# Patient Record
Sex: Male | Born: 1950 | Race: White | Hispanic: No | Marital: Married | Smoking: Never smoker
Health system: Southern US, Community
[De-identification: ages and names within clinical notes are randomized; demographics above are authoritative.]

## PROBLEM LIST (undated history)

## (undated) DIAGNOSIS — G629 Polyneuropathy, unspecified: Secondary | ICD-10-CM

## (undated) DIAGNOSIS — I10 Essential (primary) hypertension: Secondary | ICD-10-CM

## (undated) DIAGNOSIS — K219 Gastro-esophageal reflux disease without esophagitis: Secondary | ICD-10-CM

## (undated) HISTORY — PX: HAND SURGERY: SHX662

## (undated) HISTORY — PX: BACK SURGERY: SHX140

## (undated) HISTORY — DX: Polyneuropathy, unspecified: G62.9

## (undated) HISTORY — PX: COLONOSCOPY: SHX174

## (undated) HISTORY — PX: HERNIA REPAIR: SHX51

## (undated) HISTORY — DX: Gastro-esophageal reflux disease without esophagitis: K21.9

## (undated) HISTORY — DX: Essential (primary) hypertension: I10

---

## 2014-10-29 DIAGNOSIS — I4891 Unspecified atrial fibrillation: Secondary | ICD-10-CM

## 2014-10-29 HISTORY — DX: Unspecified atrial fibrillation: I48.91

## 2015-01-15 ENCOUNTER — Emergency Department (HOSPITAL_COMMUNITY): Payer: BLUE CROSS/BLUE SHIELD

## 2015-01-15 ENCOUNTER — Observation Stay (HOSPITAL_COMMUNITY)
Admission: EM | Admit: 2015-01-15 | Discharge: 2015-01-16 | Disposition: A | Discharge status: Home / Self Care | Payer: BLUE CROSS/BLUE SHIELD | Attending: Internal Medicine | Admitting: Internal Medicine

## 2015-01-15 ENCOUNTER — Other Ambulatory Visit (HOSPITAL_COMMUNITY): Payer: Self-pay

## 2015-01-15 ENCOUNTER — Encounter (HOSPITAL_COMMUNITY): Payer: Self-pay | Admitting: Emergency Medicine

## 2015-01-15 DIAGNOSIS — R109 Unspecified abdominal pain: Secondary | ICD-10-CM | POA: Insufficient documentation

## 2015-01-15 DIAGNOSIS — A084 Viral intestinal infection, unspecified: Secondary | ICD-10-CM | POA: Diagnosis present

## 2015-01-15 DIAGNOSIS — I4891 Unspecified atrial fibrillation: Secondary | ICD-10-CM | POA: Diagnosis not present

## 2015-01-15 DIAGNOSIS — E876 Hypokalemia: Secondary | ICD-10-CM | POA: Diagnosis present

## 2015-01-15 DIAGNOSIS — R002 Palpitations: Secondary | ICD-10-CM

## 2015-01-15 DIAGNOSIS — R112 Nausea with vomiting, unspecified: Secondary | ICD-10-CM | POA: Insufficient documentation

## 2015-01-15 DIAGNOSIS — R197 Diarrhea, unspecified: Secondary | ICD-10-CM | POA: Diagnosis not present

## 2015-01-15 DIAGNOSIS — Z79899 Other long term (current) drug therapy: Secondary | ICD-10-CM | POA: Insufficient documentation

## 2015-01-15 LAB — CLOSTRIDIUM DIFFICILE BY PCR: Toxigenic C. Difficile by PCR: NEGATIVE

## 2015-01-15 LAB — COMPREHENSIVE METABOLIC PANEL
ALBUMIN: 4.5 g/dL (ref 3.5–5.2)
ALT: 46 U/L (ref 0–53)
ANION GAP: 11 (ref 5–15)
AST: 28 U/L (ref 0–37)
Alkaline Phosphatase: 69 U/L (ref 39–117)
BUN: 35 mg/dL — ABNORMAL HIGH (ref 6–23)
CO2: 21 mmol/L (ref 19–32)
Calcium: 9.5 mg/dL (ref 8.4–10.5)
Chloride: 108 mmol/L (ref 96–112)
Creatinine, Ser: 0.76 mg/dL (ref 0.50–1.35)
GFR calc Af Amer: >90 mL/min (ref >90)
Glucose, Bld: 99 mg/dL (ref 70–99)
Potassium: 2.9 mmol/L — ABNORMAL LOW (ref 3.5–5.1)
Sodium: 140 mmol/L (ref 135–145)
Total Bilirubin: 1.2 mg/dL (ref 0.3–1.2)
Total Protein: 6.9 g/dL (ref 6.0–8.3)

## 2015-01-15 LAB — CBC WITH DIFFERENTIAL/PLATELET
BASOS PCT: 0 % (ref 0–1)
Basophils Absolute: 0 10*3/uL (ref 0.0–0.1)
EOS PCT: 2 % (ref 0–5)
Eosinophils Absolute: 0.2 10*3/uL (ref 0.0–0.7)
HEMATOCRIT: 49.8 % (ref 39.0–52.0)
Hemoglobin: 17.4 g/dL — ABNORMAL HIGH (ref 13.0–17.0)
Lymphocytes Relative: 9 % — ABNORMAL LOW (ref 12–46)
Lymphs Abs: 1 10*3/uL (ref 0.7–4.0)
MCH: 30.7 pg (ref 26.0–34.0)
MCHC: 34.9 g/dL (ref 30.0–36.0)
MCV: 88 fL (ref 78.0–100.0)
MONOS PCT: 10 % (ref 3–12)
Monocytes Absolute: 1.2 10*3/uL — ABNORMAL HIGH (ref 0.1–1.0)
Neutro Abs: 9.2 10*3/uL — ABNORMAL HIGH (ref 1.7–7.7)
Neutrophils Relative %: 80 % — ABNORMAL HIGH (ref 43–77)
Platelets: 96 10*3/uL — ABNORMAL LOW (ref 150–400)
RBC: 5.66 MIL/uL (ref 4.22–5.81)
RDW: 14.4 % (ref 11.5–15.5)
WBC: 11.6 10*3/uL — ABNORMAL HIGH (ref 4.0–10.5)

## 2015-01-15 LAB — URINALYSIS, ROUTINE W REFLEX MICROSCOPIC
BILIRUBIN URINE: NEGATIVE
Glucose, UA: NEGATIVE mg/dL
Ketones, ur: NEGATIVE mg/dL
LEUKOCYTES UA: NEGATIVE
Nitrite: NEGATIVE
PROTEIN: 30 mg/dL — AB
SPECIFIC GRAVITY, URINE: 1.02 (ref 1.005–1.030)
UROBILINOGEN UA: 0.2 mg/dL (ref 0.0–1.0)
pH: 6.5 (ref 5.0–8.0)

## 2015-01-15 LAB — URINE MICROSCOPIC-ADD ON

## 2015-01-15 LAB — TSH: TSH: 0.949 u[IU]/mL (ref 0.350–4.500)

## 2015-01-15 LAB — I-STAT CG4 LACTIC ACID, ED: Lactic Acid, Venous: 1.63 mmol/L (ref 0.5–2.0)

## 2015-01-15 LAB — POC OCCULT BLOOD, ED: FECAL OCCULT BLD: POSITIVE — AB

## 2015-01-15 LAB — LIPASE, BLOOD: Lipase: 22 U/L (ref 11–59)

## 2015-01-15 LAB — TROPONIN I

## 2015-01-15 MED ORDER — SODIUM CHLORIDE 0.9 % IJ SOLN: 3.0000 mL | Freq: Two times a day (BID) | INTRAMUSCULAR | Status: DC

## 2015-01-15 MED ORDER — PANTOPRAZOLE SODIUM 40 MG PO TBEC
40.0000 mg | DELAYED_RELEASE_TABLET | Freq: Every day | ORAL | Status: DC
Start: 2015-01-15 — End: 2015-01-16
  Administered 2015-01-16: 40 mg via ORAL
  Filled 2015-01-15 (×2): qty 1

## 2015-01-15 MED ORDER — LISINOPRIL 5 MG PO TABS
5.0000 mg | ORAL_TABLET | Freq: Every day | ORAL | Status: DC
  Administered 2015-01-16: 5 mg via ORAL
  Filled 2015-01-15: qty 1

## 2015-01-15 MED ORDER — DILTIAZEM LOAD VIA INFUSION
10.0000 mg | Freq: Once | INTRAVENOUS | Status: AC
  Administered 2015-01-15: 10 mg via INTRAVENOUS
  Filled 2015-01-15: qty 10

## 2015-01-15 MED ORDER — ONDANSETRON HCL 4 MG PO TABS: 4.0000 mg | ORAL_TABLET | Freq: Four times a day (QID) | ORAL | Status: DC | PRN

## 2015-01-15 MED ORDER — DILTIAZEM HCL 100 MG IV SOLR
5.0000 mg/h | INTRAVENOUS | Status: DC
  Administered 2015-01-15 (×2): 5 mg/h via INTRAVENOUS
  Filled 2015-01-15: qty 100

## 2015-01-15 MED ORDER — ALUM & MAG HYDROXIDE-SIMETH 200-200-20 MG/5ML PO SUSP: 30.0000 mL | Freq: Four times a day (QID) | ORAL | Status: DC | PRN

## 2015-01-15 MED ORDER — POTASSIUM CHLORIDE CRYS ER 20 MEQ PO TBCR
40.0000 meq | EXTENDED_RELEASE_TABLET | Freq: Once | ORAL | Status: AC
  Administered 2015-01-15: 40 meq via ORAL
  Filled 2015-01-15: qty 2

## 2015-01-15 MED ORDER — ACETAMINOPHEN 325 MG PO TABS: 650.0000 mg | ORAL_TABLET | Freq: Four times a day (QID) | ORAL | Status: DC | PRN

## 2015-01-15 MED ORDER — ONDANSETRON HCL 4 MG/2ML IJ SOLN: 4.0000 mg | Freq: Four times a day (QID) | INTRAMUSCULAR | Status: DC | PRN

## 2015-01-15 MED ORDER — SODIUM CHLORIDE 0.9 % IV BOLUS (SEPSIS)
500.0000 mL | Freq: Once | INTRAVENOUS | Status: AC
  Administered 2015-01-15: 500 mL via INTRAVENOUS

## 2015-01-15 MED ORDER — ACETAMINOPHEN 650 MG RE SUPP: 650.0000 mg | Freq: Four times a day (QID) | RECTAL | Status: DC | PRN

## 2015-01-15 MED ORDER — DIPHENOXYLATE-ATROPINE 2.5-0.025 MG PO TABS
1.0000 | ORAL_TABLET | Freq: Four times a day (QID) | ORAL | Status: DC | PRN
  Filled 2015-01-15: qty 1

## 2015-01-15 MED ORDER — POTASSIUM CHLORIDE IN NACL 40-0.9 MEQ/L-% IV SOLN
INTRAVENOUS | Status: DC
  Administered 2015-01-16: 100 mL/h via INTRAVENOUS

## 2015-01-15 MED ORDER — ATORVASTATIN CALCIUM 10 MG PO TABS
5.0000 mg | ORAL_TABLET | Freq: Every day | ORAL | Status: DC
  Filled 2015-01-15: qty 1

## 2015-01-15 NOTE — ED Provider Notes (Addendum)
CSN: [id number]     Arrival date & time 01/15/15  1401 History  This chart was scribed for Ronald Creasehristopher J Pollina, MD by Roxy Cedarhandni Bhalodia, ED Scribe. This patient was seen in room APA05/APA05 and the patient's care was started at 2:17 PM.   Chief Complaint  Patient presents with  . Nausea  . Diarrhea  . Palpitations   Patient is a 64 y.o. male presenting with diarrhea and palpitations. The history is provided by the patient. No language interpreter was used.  Diarrhea Quality:  Watery Severity:  Moderate Timing:  Intermittent Progression:  Improving Relieved by: ice chips. Worsened by:  Nothing tried Associated symptoms: abdominal pain and vomiting   Palpitations Associated symptoms: nausea and vomiting    HPI Comments: Ronald Rivas is a 64 y.o. male who presents to the Emergency Department complaining of nausea and vomiting that began 2 nights ago. He reports associated abdominal cramping, and diarrhea. He states that he ate ice chips last night and states that the abdominal cramping has since relieved. He currently reports watery stools . He denies associated trouble breathing. He also reports onset of heart palpitations. He denies recent use of antibiotic medications. He denies recent travel. Patient states that he has been on the "whole 30 diet" since November, and has been eating healthy and intentionally losing weight. He denies prior history of atrial fibrillation.  History reviewed. No pertinent past medical history. History reviewed. No pertinent past surgical history. History reviewed. No pertinent family history. History  Substance Use Topics  . Smoking status: Never Smoker   . Smokeless tobacco: Not on file  . Alcohol Use: No   Review of Systems  Cardiovascular: Positive for palpitations.  Gastrointestinal: Positive for nausea, vomiting, abdominal pain and diarrhea.  All other systems reviewed and are negative.  Allergies  Review of patient's allergies indicates no  known allergies.  Home Medications   Prior to Admission medications   Medication Sig Start Date End Date Taking? Authorizing Provider  atorvastatin (LIPITOR) 10 MG tablet Take 5 mg by mouth daily.   Yes Historical Provider, MD  CALCIUM PO Take 1 tablet by mouth daily.   Yes Historical Provider, MD  lisinopril (PRINIVIL,ZESTRIL) 5 MG tablet Take 5 mg by mouth daily.   Yes Historical Provider, MD  Multiple Vitamin (MULTIVITAMIN WITH MINERALS) TABS tablet Take 1 tablet by mouth daily.   Yes Historical Provider, MD  omeprazole (PRILOSEC) 20 MG capsule Take 20 mg by mouth daily.   Yes Historical Provider, MD   Triage Vitals: BP 127/86 mmHg  Pulse 86  Temp(Src) 97.8 F (36.6 C) (Oral)  Resp 18  Ht 5\' 8"  (1.727 m)  Wt 184 lb (83.462 kg)  BMI 27.98 kg/m2  SpO2 93%  Physical Exam  Constitutional: He is oriented to person, place, and time. He appears well-developed and well-nourished. No distress.  HENT:  Head: Normocephalic and atraumatic.  Right Ear: Hearing normal.  Left Ear: Hearing normal.  Nose: Nose normal.  Mouth/Throat: Oropharynx is clear and moist and mucous membranes are normal.  Eyes: Conjunctivae and EOM are normal. Pupils are equal, round, and reactive to light.  Neck: Normal range of motion. Neck supple.  Cardiovascular: S1 normal and S2 normal.  A regularly irregular rhythm present. Tachycardia present.  Exam reveals no gallop and no friction rub.   No murmur heard. Pulmonary/Chest: Effort normal and breath sounds normal. No respiratory distress. He exhibits no tenderness.  Abdominal: Soft. Normal appearance and bowel sounds are normal. There is no  hepatosplenomegaly. There is no tenderness. There is no rebound, no guarding, no tenderness at McBurney's point and negative Murphy's sign. No hernia.  Genitourinary: Prostate is enlarged.  Prostate was slightly enlarged. Stool was weakly positive.  Musculoskeletal: Normal range of motion.  Neurological: He is alert and  oriented to person, place, and time. He has normal strength. No cranial nerve deficit or sensory deficit. Coordination normal. GCS eye subscore is 4. GCS verbal subscore is 5. GCS motor subscore is 6.  Skin: Skin is warm, dry and intact. No rash noted. No cyanosis.  Psychiatric: He has a normal mood and affect. His speech is normal and behavior is normal. Thought content normal.  Nursing note and vitals reviewed.  ED Course  Procedures (including critical care time)  DIAGNOSTIC STUDIES: Oxygen Saturation is 93% on RA, normal by my interpretation.    COORDINATION OF CARE: 2:22 PM- Discussed plans to order diagnostic CXR, EKG, and lab work. Will perform rectal exam. Will give patient IV fluids. Pt advised of plan for treatment and pt agrees.  Labs Review Labs Reviewed  CBC WITH DIFFERENTIAL/PLATELET - Abnormal; Notable for the following:    WBC 11.6 (*)    Hemoglobin 17.4 (*)    Platelets 96 (*)    Neutrophils Relative % 80 (*)    Neutro Abs 9.2 (*)    Lymphocytes Relative 9 (*)    Monocytes Absolute 1.2 (*)    All other components within normal limits  COMPREHENSIVE METABOLIC PANEL - Abnormal; Notable for the following:    Potassium 2.9 (*)    BUN 35 (*)    All other components within normal limits  URINALYSIS, ROUTINE W REFLEX MICROSCOPIC - Abnormal; Notable for the following:    APPearance HAZY (*)    Hgb urine dipstick TRACE (*)    Protein, ur 30 (*)    All other components within normal limits  POC OCCULT BLOOD, ED - Abnormal; Notable for the following:    Fecal Occult Bld POSITIVE (*)    All other components within normal limits  CLOSTRIDIUM DIFFICILE BY PCR  LIPASE, BLOOD  TROPONIN I  URINE MICROSCOPIC-ADD ON  I-STAT CG4 LACTIC ACID, ED    Imaging Review Dg Chest Port 1 View  01/15/2015   CLINICAL DATA:  Palpitations  EXAM: PORTABLE CHEST - 1 VIEW  COMPARISON:  None.  FINDINGS: Lungs are clear.  No pleural effusion or pneumothorax.  The heart is normal in size.   IMPRESSION: No evidence of acute cardiopulmonary disease.   Electronically Signed   By: Charline Bills M.D.   On: 01/15/2015 14:56     EKG Interpretation   Date/Time:  Saturday January 15 2015 14:21:08 EDT Ventricular Rate:  176 PR Interval:    QRS Duration: 93 QT Interval:  257 QTC Calculation: 440 R Axis:   47 Text Interpretation:  Atrial fibrillation with rapid V-rate Ventricular  premature complex Borderline low voltage, extremity leads Repolarization  abnormality, prob rate related No previous tracing Confirmed by POLLINA   MD, CHRISTOPHER [phone]) on 01/15/2015 2:54:47 PM       EKG Interpretation  Date/Time:  Saturday January 15 2015 17:13:04 EDT Ventricular Rate:  93 PR Interval:  192 QRS Duration: 99 QT Interval:  353 QTC Calculation: 439 R Axis:   75 Text Interpretation:  Sinus rhythm Consider left atrial enlargement Otherwise within normal limits Confirmed by POLLINA  MD, CHRISTOPHER [phone]) on 01/15/2015 6:10:50 PM       MDM   Final diagnoses:  Palpitations  Atrial fibrillation with RVR   Patient referred to the emergency department from urgent care for atrophic relation. Patient does not have a history of atrial fibrillation. Patient was for the last 2 days with nausea, vomiting and diarrhea. He started noticing palpitations last night. Upon arrival to the ER, patient was in nature fibrillation with rapid ventricular response in the 160s. He is not expressing any chest pain. Cardiac troponin was negative. EKG shows rate related changes, no obvious ischemia and no infarct.  Patient was placed on Cardizem drip. Because of the GI symptoms, rectal exam was performed and he was positive for occult blood. Because of this, was not administered Lovenox or heparin. Patient did achieve rate control with the Cardizem drip. He did have an episode of hypotension with blood pressure in the 60s where he had a brief syncope or near-syncope. He was laid flat, given a fluid bolus and  had significant improvement. Patient has now converted to sinus rhythm, repeat EKG shows no acute abnormalities.  I personally performed the services described in this documentation, which was scribed in my presence. The recorded information has been reviewed and is accurate.    Ronald Crease, MD 01/15/15 1719  Ronald Crease, MD 01/15/15 [phone]

## 2015-01-15 NOTE — ED Notes (Signed)
Urgent care sent pt here due to palpatations and N/V.

## 2015-01-15 NOTE — ED Notes (Signed)
Hospitalist in room with pt. 

## 2015-01-15 NOTE — H&P (Addendum)
History and Physical  Ronald Rivas RUE:[id number] DOB: 1951/06/09 DOA: 01/15/2015  Referring physician: Dr Blinda LeatherwoodPollina, ED physician PCP: No primary care provider on file.   Chief Complaint: Diarrhea and vomiting  HPI: Ronald Rivas is a 64 y.o. male  With a history of hypertension, hyperlipidemia who is seen in margins apart for 2 days of nausea, vomiting, and diarrhea. The patient presented to a walk-in clinic for care and was noticed to be in atrial fibrillation with rapid ventricular response and was sent to the hospital for evaluation and treatment. The patient hasn't noted any chest pain, shortness of breath, palpitations. His nausea and vomiting have improved but continues to have watery diarrhea. Bland diet has not improved his symptoms. Has had a fever of 100. No known sick contacts   Review of Systems:   Pt denies any chills, abdominal pain, chest pain, shortness of breath, palpitations, lightheadedness, dizziness, headaches, blurred vision.  Review of systems are otherwise negative  History reviewed. No pertinent past medical history. History reviewed. No pertinent past surgical history. Social History:  reports that he has never smoked. He does not have any smokeless tobacco history on file. He reports that he does not drink alcohol or use illicit drugs. Patient lives at home & is able to participate in activities of daily living  No Known Allergies  History reviewed. No pertinent family history. family history of hypertension   Prior to Admission medications   Medication Sig Start Date End Date Taking? Authorizing Provider  atorvastatin (LIPITOR) 10 MG tablet Take 5 mg by mouth daily.   Yes Historical Provider, MD  CALCIUM PO Take 1 tablet by mouth daily.   Yes Historical Provider, MD  lisinopril (PRINIVIL,ZESTRIL) 5 MG tablet Take 5 mg by mouth daily.   Yes Historical Provider, MD  Multiple Vitamin (MULTIVITAMIN WITH MINERALS) TABS tablet Take 1 tablet by mouth daily.   Yes  Historical Provider, MD  omeprazole (PRILOSEC) 20 MG capsule Take 20 mg by mouth daily.   Yes Historical Provider, MD    Physical Exam: BP 113/74 mmHg  Pulse 98  Temp(Src) 97.8 F (36.6 C) (Oral)  Resp 18  Ht 5\' 8"  (1.727 m)  Wt 83.462 kg (184 lb)  BMI 27.98 kg/m2  SpO2 99%  General: Older Caucasian male. Awake and alert and oriented x3. No acute cardiopulmonary distress.  Eyes: Pupils equal, round, reactive to light. Extraocular muscles are intact. Sclerae anicteric and noninjected.  ENT:  Dry mucosal membranes. No mucosal lesions.  Neck: Neck supple without lymphadenopathy. No carotid bruits. No masses palpated.  Cardiovascular: Regular rate with normal S1-S2 sounds. No murmurs, rubs, gallops auscultated. No JVD.  Respiratory: Good respiratory effort with no wheezes, rales, rhonchi. Lungs clear to auscultation bilaterally.  Abdomen: Soft, mild tenderness, nondistended. Active bowel sounds. No masses or hepatosplenomegaly  Skin: Dry, warm to touch. 2+ dorsalis pedis and radial pulses. Musculoskeletal: No calf or leg pain. All major joints not erythematous nontender.  Psychiatric: Intact judgment and insight.  Neurologic: No focal neurological deficits. Cranial nerves II through XII are grossly intact.           Labs on Admission:  Basic Metabolic Panel:  Recent Labs Lab 01/15/15 1504  NA 140  K 2.9*  CL 108  CO2 21  GLUCOSE 99  BUN 35*  CREATININE 0.76  CALCIUM 9.5   Liver Function Tests:  Recent Labs Lab 01/15/15 1504  AST 28  ALT 46  ALKPHOS 69  BILITOT 1.2  PROT 6.9  ALBUMIN  4.5    Recent Labs Lab 01/15/15 1504  LIPASE 22   No results for input(s): AMMONIA in the last 168 hours. CBC:  Recent Labs Lab 01/15/15 1504  WBC 11.6*  NEUTROABS 9.2*  HGB 17.4*  HCT 49.8  MCV 88.0  PLT 96*   Cardiac Enzymes:  Recent Labs Lab 01/15/15 1504  TROPONINI <0.03    BNP (last 3 results) No results for input(s): BNP in the last 8760  hours.  ProBNP (last 3 results) No results for input(s): PROBNP in the last 8760 hours.  CBG: No results for input(s): GLUCAP in the last 168 hours.  Radiological Exams on Admission: Dg Chest Port 1 View  01/15/2015   CLINICAL DATA:  Palpitations  EXAM: PORTABLE CHEST - 1 VIEW  COMPARISON:  None.  FINDINGS: Lungs are clear.  No pleural effusion or pneumothorax.  The heart is normal in size.  IMPRESSION: No evidence of acute cardiopulmonary disease.   Electronically Signed   By: Charline Bills M.D.   On: 01/15/2015 14:56    EKG: Independently reviewed. Atrial fibrillation with rapid ventricular response. No ST changes  Assessment/Plan Present on Admission:  . Atrial fibrillation with RVR . Hypokalemia . Viral gastroenteritis  Patient's age fibrillation has resolved. However, will admit the patient to telemetry for observation overnight to assure that the patient does not rebound into atrial fibrillation with RVR. Hypokalemia likely causative to the patient's A. Fib We'll check TSH Replace potassium Will rehydrate and provide symptomatically support for the patient's viral gastroenteritis Continue home medications  DVT prophylaxis: PlexiPulse  Consultants: None  Code Status: Full code  Family Communication: Wife in the room   Disposition Plan: Home following observation  Levie Heritage, DO [facility] Pager [phone]

## 2015-01-15 NOTE — ED Notes (Signed)
L. Cardwell in room with pt, suddenly c/o of nausea and feeling hot.  Pt laid flat.  Dr Blinda LeatherwoodPollina called into room.  Ordered to given NS bolus 1000 ml and continue Cardizem at 10mg  per her.  Hr Afib 106-155.  Current bp 106/68

## 2015-01-16 DIAGNOSIS — A084 Viral intestinal infection, unspecified: Secondary | ICD-10-CM

## 2015-01-16 DIAGNOSIS — I4891 Unspecified atrial fibrillation: Secondary | ICD-10-CM

## 2015-01-16 DIAGNOSIS — E876 Hypokalemia: Secondary | ICD-10-CM

## 2015-01-16 LAB — BASIC METABOLIC PANEL
ANION GAP: 5 (ref 5–15)
BUN: 26 mg/dL — AB (ref 6–23)
CO2: 22 mmol/L (ref 19–32)
CREATININE: 0.64 mg/dL (ref 0.50–1.35)
Calcium: 8.5 mg/dL (ref 8.4–10.5)
Chloride: 114 mmol/L — ABNORMAL HIGH (ref 96–112)
GFR calc Af Amer: >90 mL/min (ref >90)
GFR calc non Af Amer: >90 mL/min (ref >90)
Glucose, Bld: 93 mg/dL (ref 70–99)
POTASSIUM: 3.4 mmol/L — AB (ref 3.5–5.1)
Sodium: 141 mmol/L (ref 135–145)

## 2015-01-16 LAB — CBC
HCT: 43.7 % (ref 39.0–52.0)
HEMOGLOBIN: 14.7 g/dL (ref 13.0–17.0)
MCH: 29.6 pg (ref 26.0–34.0)
MCHC: 33.6 g/dL (ref 30.0–36.0)
MCV: 88.1 fL (ref 78.0–100.0)
Platelets: 88 10*3/uL — ABNORMAL LOW (ref 150–400)
RBC: 4.96 MIL/uL (ref 4.22–5.81)
RDW: 14.7 % (ref 11.5–15.5)
WBC: 8.1 10*3/uL (ref 4.0–10.5)

## 2015-01-16 LAB — MAGNESIUM: Magnesium: 1.8 mg/dL (ref 1.5–2.5)

## 2015-01-16 MED ORDER — ASPIRIN EC 81 MG PO TBEC: 81.0000 mg | DELAYED_RELEASE_TABLET | Freq: Every day | ORAL | Status: DC

## 2015-01-16 NOTE — Discharge Summary (Signed)
Physician Discharge Summary  Ronald Rivas ZOX:[id number] DOB: 1951-10-07 DOA: 01/15/2015  PCP: No primary care provider on file.  Admit date: 01/15/2015 Discharge date: 01/16/2015  Time spent: 20 minutes  Recommendations for Outpatient Follow-up:  1. Follow up with PCP in 1-2 weeks  Discharge Diagnoses:  Active Problems:   Atrial fibrillation with RVR   Hypokalemia   Viral gastroenteritis   Discharge Condition: Improved  Diet recommendation: Heart healthy  Filed Weights   01/15/15 1409  Weight: 83.462 kg (184 lb)    History of present illness:  Please review h and p from 3/19 for details. Briefly, pt presents with diarrhea with n/v and noted to have afib with RVR in the setting of hypokalemia. The patient was admitted for further work up.  Hospital Course:  The patient was admitted to the floor. Stools were found to be neg for cdiff. The patient's potassium was corrected with resolution of afib. Afib suspected to be secondary to profound hypokalemia. The patient remained stable overnight and was able to tolerate regular diet without difficulty. The patient is otherwise medically stable for close outpatient follow up. He will continue low dose ASA on discharge until seen by PCP within 1-2 weeks.  Discharge Exam: Filed Vitals:   01/15/15 1830 01/15/15 1845 01/15/15 2218 01/16/15 0650  BP: 112/70 110/67 119/66 106/71  Pulse:  78 78 69  Temp:  99.6 F (37.6 C) 99.6 F (37.6 C) 97.9 F (36.6 C)  TempSrc:  Oral Oral Oral  Resp: 17 18 18 18   Height:      Weight:      SpO2:  99% 98% 98%    General: Awake, in nad Cardiovascular: regular, s1, s2 Respiratory: normal resp effort, no wheezing  Discharge Instructions     Medication List    TAKE these medications        aspirin EC 81 MG tablet  Take 1 tablet (81 mg total) by mouth daily.     atorvastatin 10 MG tablet  Commonly known as:  LIPITOR  Take 5 mg by mouth daily.     CALCIUM PO  Take 1 tablet by mouth  daily.     lisinopril 5 MG tablet  Commonly known as:  PRINIVIL,ZESTRIL  Take 5 mg by mouth daily.     multivitamin with minerals Tabs tablet  Take 1 tablet by mouth daily.     omeprazole 20 MG capsule  Commonly known as:  PRILOSEC  Take 20 mg by mouth daily.       No Known Allergies Follow-up Information    Follow up with Follow up with your PCP in 1-2 weeks.       The results of significant diagnostics from this hospitalization (including imaging, microbiology, ancillary and laboratory) are listed below for reference.    Significant Diagnostic Studies: Dg Chest Port 1 View  01/15/2015   CLINICAL DATA:  Palpitations  EXAM: PORTABLE CHEST - 1 VIEW  COMPARISON:  None.  FINDINGS: Lungs are clear.  No pleural effusion or pneumothorax.  The heart is normal in size.  IMPRESSION: No evidence of acute cardiopulmonary disease.   Electronically Signed   By: Charline BillsSriyesh  Krishnan M.D.   On: 01/15/2015 14:56    Microbiology: Recent Results (from the past 240 hour(s))  Clostridium Difficile by PCR     Status: None   Collection Time: 01/15/15  4:00 PM  Result Value Ref Range Status   C difficile by pcr NEGATIVE NEGATIVE Final     Labs: Basic Metabolic  Panel:  Recent Labs Lab 01/15/15 1504 01/16/15 0643 01/16/15 0807  NA 140 141  --   K 2.9* 3.4*  --   CL 108 114*  --   CO2 21 22  --   GLUCOSE 99 93  --   BUN 35* 26*  --   CREATININE 0.76 0.64  --   CALCIUM 9.5 8.5  --   MG  --   --  1.8   Liver Function Tests:  Recent Labs Lab 01/15/15 1504  AST 28  ALT 46  ALKPHOS 69  BILITOT 1.2  PROT 6.9  ALBUMIN 4.5    Recent Labs Lab 01/15/15 1504  LIPASE 22   No results for input(s): AMMONIA in the last 168 hours. CBC:  Recent Labs Lab 01/15/15 1504 01/16/15 0643  WBC 11.6* 8.1  NEUTROABS 9.2*  --   HGB 17.4* 14.7  HCT 49.8 43.7  MCV 88.0 88.1  PLT 96* 88*   Cardiac Enzymes:  Recent Labs Lab 01/15/15 1504  TROPONINI <0.03   BNP: BNP (last 3  results) No results for input(s): BNP in the last 8760 hours.  ProBNP (last 3 results) No results for input(s): PROBNP in the last 8760 hours.  CBG: No results for input(s): GLUCAP in the last 168 hours.  Signed:  Monti Rivas, Scheryl Marten  [facility] 01/16/2015, 12:22 PM

## 2015-01-16 NOTE — Progress Notes (Signed)
UR completed 

## 2015-01-16 NOTE — Progress Notes (Signed)
Ronald Rivas discharged home with wife per MD order.  Discharge instructions reviewed and discussed with the patient, all questions and concerns answered. Copy of instructions and scripts given to patient.    Medication List    TAKE these medications        aspirin EC 81 MG tablet  Take 1 tablet (81 mg total) by mouth daily.     atorvastatin 10 MG tablet  Commonly known as:  LIPITOR  Take 5 mg by mouth daily.     CALCIUM PO  Take 1 tablet by mouth daily.     lisinopril 5 MG tablet  Commonly known as:  PRINIVIL,ZESTRIL  Take 5 mg by mouth daily.     multivitamin with minerals Tabs tablet  Take 1 tablet by mouth daily.     omeprazole 20 MG capsule  Commonly known as:  PRILOSEC  Take 20 mg by mouth daily.        Patients skin is clean, dry and intact, no evidence of skin break down. IV site discontinued and catheter remains intact. Site without signs and symptoms of complications. Dressing and pressure applied.  Patient escorted to car by Ladona Ridgelaylor, RN in a wheelchair,  no distress noted upon discharge.  Ronald Rivas, Ronald Rivas 01/16/2015 1:21 PM

## 2016-05-14 IMAGING — CR DG CHEST 1V PORT
1 series · 2 of 2 positions shown · non-contrast
Comparison: None.

CLINICAL DATA: Palpitations

EXAM:
PORTABLE CHEST - 1 VIEW

[Series 1: portable · 0.17mm/px · 2 of 2 slices shown]
[im 1/2]
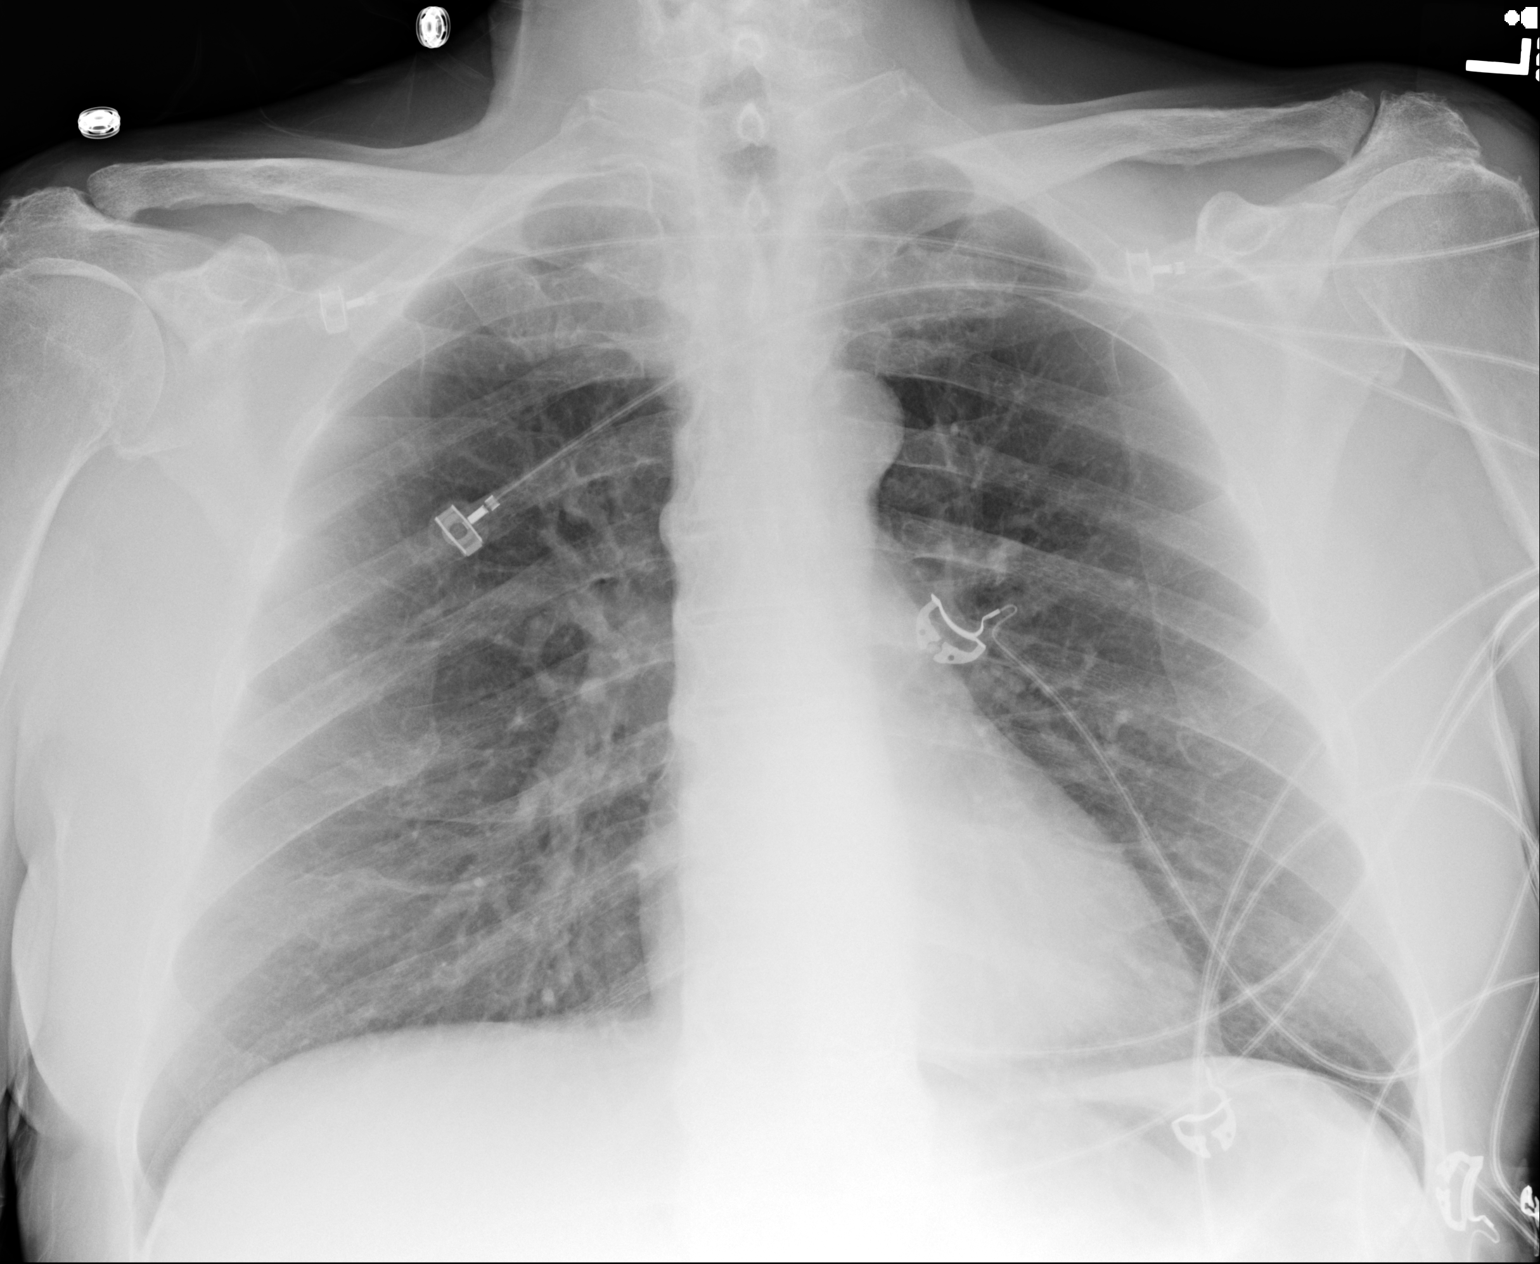
[im 2/2]
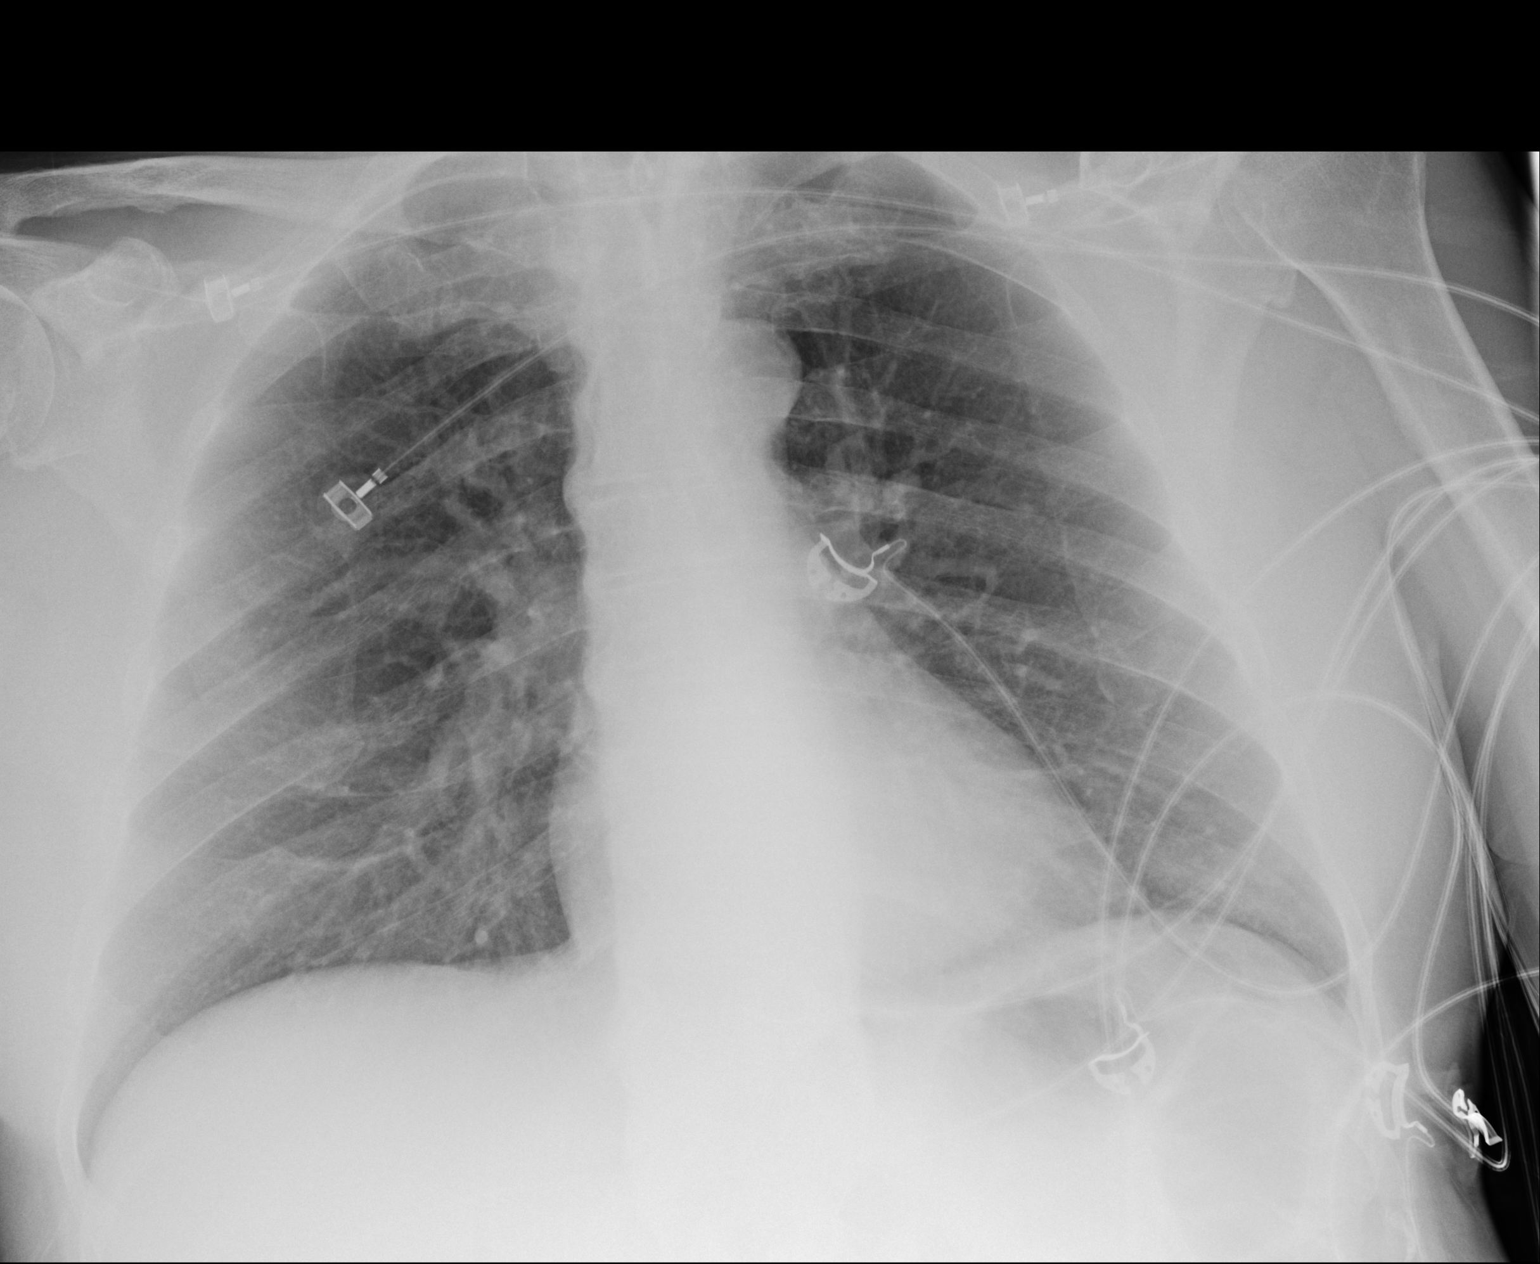

[2 of 2 positions shown; findings below may reference images not displayed]

FINDINGS: Lungs are clear.  No pleural effusion or pneumothorax.

The heart is normal in size.
IMPRESSION: No evidence of acute cardiopulmonary disease.

## 2022-11-19 ENCOUNTER — Encounter: Payer: Self-pay | Admitting: Internal Medicine

## 2022-12-12 ENCOUNTER — Ambulatory Visit (INDEPENDENT_AMBULATORY_CARE_PROVIDER_SITE_OTHER): Payer: Medicare Other | Admitting: Internal Medicine

## 2022-12-12 ENCOUNTER — Encounter: Payer: Self-pay | Admitting: Internal Medicine

## 2022-12-12 VITALS — BP 122/68 | HR 64 | Ht 68.0 in | Wt 189.0 lb

## 2022-12-12 DIAGNOSIS — R1310 Dysphagia, unspecified: Secondary | ICD-10-CM | POA: Diagnosis not present

## 2022-12-12 DIAGNOSIS — K219 Gastro-esophageal reflux disease without esophagitis: Secondary | ICD-10-CM

## 2022-12-12 DIAGNOSIS — K5900 Constipation, unspecified: Secondary | ICD-10-CM

## 2022-12-12 DIAGNOSIS — R194 Change in bowel habit: Secondary | ICD-10-CM

## 2022-12-12 MED ORDER — PLENVU 140 G PO SOLR: 1.0000 | Freq: Once | ORAL | 0 refills | Status: AC

## 2022-12-12 NOTE — Patient Instructions (Signed)
_______________________________________________________  If your blood pressure at your visit was 140/90 or greater, please contact your primary care physician to follow up on this.  _______________________________________________________  If you are age 73 or older, your body mass index should be between 23-30. Your Body mass index is 28.74 kg/m. If this is out of the aforementioned range listed, please consider follow up with your Primary Care Provider.  If you are age 30 or younger, your body mass index should be between 19-25. Your Body mass index is 28.74 kg/m. If this is out of the aformentioned range listed, please consider follow up with your Primary Care Provider.   ________________________________________________________  The [facility] providers would like to encourage you to use [facility] to communicate with providers for non-urgent requests or questions.  Due to long hold times on the telephone, sending your provider a message by [facility] may be a faster and more efficient way to get a response.  Please allow 48 business hours for a response.  Please remember that this is for non-urgent requests.  _______________________________________________________  Dennis Bast have been scheduled for an endoscopy and colonoscopy. Please follow the written instructions given to you at your visit today. Please pick up your prep supplies at the pharmacy within the next 1-3 days. If you use inhalers (even only as needed), please bring them with you on the day of your procedure.  Take Miralax daily

## 2022-12-12 NOTE — Progress Notes (Signed)
HISTORY OF PRESENT ILLNESS:  Ronald Rivas is a 72 y.o. 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  He is accompanied by his wife.  Patient reports remote colonoscopy in [place].  States he was told that he has "pockets".  Tells me that he was in his usual state of health until a few months ago when he has noticed abrupt change in his bowel habits.  He describes constipation.  Decreased stool caliber.  Has been taking prune juice which helps.  Also feels like he has swollen hemorrhoids.  He is concerned.  Good appetite.  No weight loss.  No bleeding.  He reports a history of GERD.  He takes omeprazole sporadically.  This helps.  He also reports some intermittent dysphagia.  Does tell me that he did require esophageal dilation on 1 occasion remotely.  He is not on blood thinners  REVIEW OF SYSTEMS:  All non-GI ROS negative unless otherwise stated in the HPI except for back pain, hearing impairment  Past Medical History:  Diagnosis Date   GERD (gastroesophageal reflux disease)    Hypertension     Past Surgical History:  Procedure Laterality Date   BACK SURGERY     COLONOSCOPY     HERNIA REPAIR      Social History Ronald Rivas  reports that he has never smoked. He does not have any smokeless tobacco history on file. He reports that he does not drink alcohol and does not use drugs.  family history is not on file.  No Known Allergies     PHYSICAL EXAMINATION: Vital signs: BP 122/68   Pulse 64   Ht 5' 8"$  (1.727 m)   Wt 189 lb (85.7 kg)   SpO2 96%   BMI 28.74 kg/m   Constitutional: generally well-appearing, no acute distress.  Hard of hearing Psychiatric: alert and oriented x3, cooperative Eyes: extraocular movements intact, anicteric, conjunctiva pink Mouth: oral pharynx moist, no lesions Neck: supple no lymphadenopathy Cardiovascular: heart regular rate and rhythm, no murmur Lungs: clear to  auscultation bilaterally Abdomen: soft, nontender, nondistended, no obvious ascites, no peritoneal signs, normal bowel sounds, no organomegaly Rectal: Deferred until colonoscopy Extremities: no clubbing, cyanosis, or lower extremity edema bilaterally Skin: no lesions on visible extremities Neuro: No focal deficits.  Cranial nerves intact  ASSESSMENT:  1.  Change in bowel habits.  Change in stool caliber and constipation.  Needs further evaluation. 2.  Probable hemorrhoids 3.  Chronic GERD 4.  Intermittent dysphagia.  Rule out peptic stricture   PLAN:  1.  Reflux precautions 2.  Continue PPI 3.  Schedule upper endoscopy with possible esophageal dilation.The nature of the procedure, as well as the risks, benefits, and alternatives were carefully and thoroughly reviewed with the patient. Ample time for discussion and questions allowed. The patient understood, was satisfied, and agreed to proceed. 4.  Schedule colonoscopy to evaluate change in bowel habits.The nature of the procedure, as well as the risks, benefits, and alternatives were carefully and thoroughly reviewed with the patient. Ample time for discussion and questions allowed. The patient understood, was satisfied, and agreed to proceed. 5.  Further recommendations after the above. A total time of 60 minutes was spent preparing to see the patient, obtaining comprehensive history, performing medically appropriate physical examination, ordering multiple endoscopic procedures including therapeutic procedure, counseling and educating the patient and his wife regarding the above listed issues, and documenting clinical information in the health record.

## 2023-01-22 ENCOUNTER — Ambulatory Visit: Payer: Medicare Other | Admitting: Internal Medicine

## 2023-01-22 ENCOUNTER — Encounter: Payer: Self-pay | Admitting: Internal Medicine

## 2023-01-22 VITALS — BP 117/63 | HR 110 | Resp 20

## 2023-01-22 DIAGNOSIS — K317 Polyp of stomach and duodenum: Secondary | ICD-10-CM

## 2023-01-22 DIAGNOSIS — K219 Gastro-esophageal reflux disease without esophagitis: Secondary | ICD-10-CM

## 2023-01-22 DIAGNOSIS — D124 Benign neoplasm of descending colon: Secondary | ICD-10-CM

## 2023-01-22 DIAGNOSIS — R194 Change in bowel habit: Secondary | ICD-10-CM

## 2023-01-22 DIAGNOSIS — I10 Essential (primary) hypertension: Secondary | ICD-10-CM | POA: Insufficient documentation

## 2023-01-22 DIAGNOSIS — D123 Benign neoplasm of transverse colon: Secondary | ICD-10-CM

## 2023-01-22 DIAGNOSIS — D122 Benign neoplasm of ascending colon: Secondary | ICD-10-CM

## 2023-01-22 DIAGNOSIS — Z1211 Encounter for screening for malignant neoplasm of colon: Secondary | ICD-10-CM

## 2023-01-22 DIAGNOSIS — K5900 Constipation, unspecified: Secondary | ICD-10-CM

## 2023-01-22 DIAGNOSIS — R1310 Dysphagia, unspecified: Secondary | ICD-10-CM

## 2023-01-22 DIAGNOSIS — K222 Esophageal obstruction: Secondary | ICD-10-CM | POA: Diagnosis not present

## 2023-01-22 MED ORDER — SODIUM CHLORIDE 0.9 % IV SOLN: 500.0000 mL | Freq: Once | INTRAVENOUS | Status: DC

## 2023-01-22 NOTE — Patient Instructions (Addendum)
Resume previous diet. Continue present medications. Await pathology results. Contact your PCP ASAP regarding cardiology referral.  Follow dilation diet                             YOU HAD AN ENDOSCOPIC PROCEDURE TODAY AT [facility]:   Refer to the procedure report that was given to you for any specific questions about what was found during the examination.  If the procedure report does not answer your questions, please call your gastroenterologist to clarify.  If you requested that your care partner not be given the details of your procedure findings, then the procedure report has been included in a sealed envelope for you to review at your convenience later.  YOU SHOULD EXPECT: Some feelings of bloating in the abdomen. Passage of more gas than usual.  Walking can help get rid of the air that was put into your GI tract during the procedure and reduce the bloating. If you had a lower endoscopy (such as a colonoscopy or flexible sigmoidoscopy) you may notice spotting of blood in your stool or on the toilet paper. If you underwent a bowel prep for your procedure, you may not have a normal bowel movement for a few days.  Please Note:  You might notice some irritation and congestion in your nose or some drainage.  This is from the oxygen used during your procedure.  There is no need for concern and it should clear up in a day or so.  SYMPTOMS TO REPORT IMMEDIATELY:  Following lower endoscopy (colonoscopy or flexible sigmoidoscopy):  Excessive amounts of blood in the stool  Significant tenderness or worsening of abdominal pains  Swelling of the abdomen that is new, acute  Fever of 100F or higher  Following upper endoscopy (EGD)  Vomiting of blood or coffee ground material  New chest pain or pain under the shoulder blades  Painful or persistently difficult swallowing  New shortness of breath  Fever of 100F or higher  Black, tarry-looking stools  For urgent or emergent  issues, a gastroenterologist can be reached at any hour by calling [phone]. Do not use MyChart messaging for urgent concerns.    DIET:  FOLLOW DILATION DIET!!!  Drink plenty of fluids but you should avoid alcoholic beverages for 24 hours.  ACTIVITY:  You should plan to take it easy for the rest of today and you should NOT DRIVE or use heavy machinery until tomorrow (because of the sedation medicines used during the test).    FOLLOW UP: Our staff will call the number listed on your records the next business day following your procedure.  We will call around 7:15- 8:00 am to check on you and address any questions or concerns that you may have regarding the information given to you following your procedure. If we do not reach you, we will leave a message.     If any biopsies were taken you will be contacted by phone or by letter within the next 1-3 weeks.  Please call us at [phone] if you have not heard about the biopsies in 3 weeks.    SIGNATURES/CONFIDENTIALITY: You and/or your care partner have signed paperwork which will be entered into your electronic medical record.  These signatures attest to the fact that that the information above on your After Visit Summary has been reviewed and is understood.  Full responsibility of the confidentiality of this discharge information lies with you  and/or your care-partner.

## 2023-01-22 NOTE — Op Note (Signed)
[place] Patient Name: Ronald Rivas Procedure Date: 01/22/2023 1:15 PM MRN: [id number] Endoscopist: Docia Chuck. Henrene Pastor , MD, [id number] Age: 72 Referring MD:  Date of Birth: [birth year] Gender: Male Account #: [id number] Procedure:                Colonoscopy with cold snare polypectomy x 9; biopsy                            polypectomy x 1 Indications:              Screening for colorectal malignant neoplasm.                            Reports negative exam remotely elsewhere.                            Incidental change in bowel habits Medicines:                Monitored Anesthesia Care Procedure:                Pre-Anesthesia Assessment:                           - Prior to the procedure, a History and Physical                            was performed, and patient medications and                            allergies were reviewed. The patient's tolerance of                            previous anesthesia was also reviewed. The risks                            and benefits of the procedure and the sedation                            options and risks were discussed with the patient.                            All questions were answered, and informed consent                            was obtained. Prior Anticoagulants: The patient has                            taken no anticoagulant or antiplatelet agents.                            After reviewing the risks and benefits, the patient                            was deemed in satisfactory condition to undergo the  procedure.                           After obtaining informed consent, the colonoscope                            was passed under direct vision. Throughout the                            procedure, the patient's blood pressure, pulse, and                            oxygen saturations were monitored continuously. The                            CF [id number] was introduced through the  anus                            and advanced to the the cecum, identified by                            appendiceal orifice and ileocecal valve. The                            ileocecal valve, appendiceal orifice, and rectum                            were photographed. The quality of the bowel                            preparation was excellent. The colonoscopy was                            performed without difficulty. The patient tolerated                            the procedure well. The bowel preparation used was                            SUPREP via split dose instruction. Scope In: 1:27:57 PM Scope Out: 1:48:54 PM Scope Withdrawal Time: 0 hours 17 minutes 25 seconds  Total Procedure Duration: 0 hours 20 minutes 57 seconds  Findings:                 Nine polyps were found in the descending colon,                            transverse colon and ascending colon. The polyps                            were 2 to 5 mm in size. These polyps were removed                            with a cold snare. Resection and retrieval were  complete.                           A 1 mm polyp was found in the descending colon. The                            polyp was removed with a jumbo cold forceps.                            Resection and retrieval were complete.                           Multiple diverticula were found in the sigmoid                            colon.                           Internal hemorrhoids were found during retroflexion.                           The exam was otherwise without abnormality on                            direct and retroflexion views. Complications:            No immediate complications. Estimated blood loss:                            None. Estimated Blood Loss:     Estimated blood loss: none. Impression:               - Nine 2 to 5 mm polyps in the descending colon, in                            the transverse colon and in the  ascending colon,                            removed with a cold snare. Resected and retrieved.                           - One 1 mm polyp in the descending colon, removed                            with a jumbo cold forceps. Resected and retrieved.                           - Diverticulosis in the sigmoid colon.                           - Internal hemorrhoids.                           - The examination was otherwise normal on direct  and retroflexion views. Recommendation:           - Repeat colonoscopy in 1 year for surveillance if                            all polyps adenomatous, otherwise 3 years.                           - Patient has a contact number available for                            emergencies. The signs and symptoms of potential                            delayed complications were discussed with the                            patient. Return to normal activities tomorrow.                            Written discharge instructions were provided to the                            patient.                           - Resume previous diet.                           - Continue present medications.                           - Await pathology results.                           NOTE: Patient was found to be in atrial                            fibrillation with rapid ventricular rate. He has a                            history of the same. Completely asymptomatic with                            normal blood pressure.                           The patient and his wife were advised. EKG                            provided. Told to contact her PCP ASAP regarding                            cardiology referral. Docia Chuck. Henrene Pastor, MD 01/22/2023 1:58:15 PM This report has been signed electronically.

## 2023-01-22 NOTE — Progress Notes (Signed)
Called to room to assist during endoscopic procedure.  Patient ID and intended procedure confirmed with present staff. Received instructions for my participation in the procedure from the performing physician.  

## 2023-01-22 NOTE — Op Note (Signed)
[facility] Patient Name: Ronald Rivas Procedure Date: 01/22/2023 1:14 PM MRN: [id number] Endoscopist: Docia Chuck. Henrene Pastor , MD, [id number] Age: 72 Referring MD:  Date of Birth: [birth year] Gender: Male Account #: [id number] Procedure:                Upper GI endoscopy with balloon dilation of the                            esophagus?"20 mm max; w/ biopsies Indications:              Dysphagia, Esophageal reflux Medicines:                Monitored Anesthesia Care Procedure:                Pre-Anesthesia Assessment:                           - Prior to the procedure, a History and Physical                            was performed, and patient medications and                            allergies were reviewed. The patient's tolerance of                            previous anesthesia was also reviewed. The risks                            and benefits of the procedure and the sedation                            options and risks were discussed with the patient.                            All questions were answered, and informed consent                            was obtained. Prior Anticoagulants: The patient has                            taken no anticoagulant or antiplatelet agents. ASA                            Grade Assessment: II - A patient with mild systemic                            disease. After reviewing the risks and benefits,                            the patient was deemed in satisfactory condition to                            undergo the procedure.  After obtaining informed consent, the endoscope was                            passed under direct vision. Throughout the                            procedure, the patient's blood pressure, pulse, and                            oxygen saturations were monitored continuously. The                            GIF [id number] was introduced through the                            mouth, and  advanced to the second part of duodenum.                            The upper GI endoscopy was accomplished without                            difficulty. The patient tolerated the procedure                            well. Scope In: Scope Out: Findings:                 The esophagus revealed very mild stricturing at the                            gastroesophageal junction. No active inflammation.                            No Barrett's. A TTS dilator was passed through the                            scope. Dilation with an 18-19-20 mm balloon dilator                            was performed to 20 mm.                           The stomach revealed a hiatal hernia and multiple                            benign-appearing fundic gland type polyps. Biopsies                            were taken, from several representative polyps,                            with a cold forceps for histology.                           The examined duodenum was  normal.                           The cardia and gastric fundus were normal on                            retroflexion. Complications:            No immediate complications. Estimated Blood Loss:     Estimated blood loss: none. Impression:               1. GERD with mild esophageal stricture status post                            dilation to 20 mm max                           2. Hiatal hernia                           3. Incidental benign-appearing fundic gland polyps.                            Biopsied                           4. Otherwise normal EGD. Recommendation:           - Patient has a contact number available for                            emergencies. The signs and symptoms of potential                            delayed complications were discussed with the                            patient. Return to normal activities tomorrow.                            Written discharge instructions were provided to the                             patient.                           - Resume previous diet.                           - Continue present medications.                           - Await pathology results.                           NOTE: Patient was found to be in atrial                            fibrillation with rapid ventricular  rate. He has a                            history of the same. Completely asymptomatic with                            normal blood pressure.                           The patient and his wife were advised. EKG                            provided. Told to contact her PCP ASAP regarding                            cardiology referral. Docia Chuck. Henrene Pastor, MD 01/22/2023 2:10:55 PM This report has been signed electronically.

## 2023-01-22 NOTE — Progress Notes (Signed)
HISTORY OF PRESENT ILLNESS:   Ronald Rivas is a 72 y.o. 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  He is accompanied by his wife.   Patient reports remote colonoscopy in [place].  States he was told that he has "pockets".  Tells me that he was in his usual state of health until a few months ago when he has noticed abrupt change in his bowel habits.  He describes constipation.  Decreased stool caliber.  Has been taking prune juice which helps.  Also feels like he has swollen hemorrhoids.  He is concerned.  Good appetite.  No weight loss.  No bleeding.   He reports a history of GERD.  He takes omeprazole sporadically.  This helps.  He also reports some intermittent dysphagia.  Does tell me that he did require esophageal dilation on 1 occasion remotely.   He is not on blood thinners   REVIEW OF SYSTEMS:   All non-GI ROS negative unless otherwise stated in the HPI except for back pain, hearing impairment       Past Medical History:  Diagnosis Date   GERD (gastroesophageal reflux disease)     Hypertension             Past Surgical History:  Procedure Laterality Date   BACK SURGERY       COLONOSCOPY       HERNIA REPAIR          Social History Ronald Rivas  reports that he has never smoked. He does not have any smokeless tobacco history on file. He reports that he does not drink alcohol and does not use drugs.   family history is not on file.   No Known Allergies       PHYSICAL EXAMINATION: Vital signs: BP 122/68   Pulse 64   Ht 5\' 8"  (1.727 m)   Wt 189 lb (85.7 kg)   SpO2 96%   BMI 28.74 kg/m   Constitutional: generally well-appearing, no acute distress.  Hard of hearing Psychiatric: alert and oriented x3, cooperative Eyes: extraocular movements intact, anicteric, conjunctiva pink Mouth: oral pharynx moist, no lesions Neck: supple no lymphadenopathy Cardiovascular: heart regular rate and  rhythm, no murmur Lungs: clear to auscultation bilaterally Abdomen: soft, nontender, nondistended, no obvious ascites, no peritoneal signs, normal bowel sounds, no organomegaly Rectal: Deferred until colonoscopy Extremities: no clubbing, cyanosis, or lower extremity edema bilaterally Skin: no lesions on visible extremities Neuro: No focal deficits.  Cranial nerves intact   ASSESSMENT:   1.  Change in bowel habits.  Change in stool caliber and constipation.  Needs further evaluation. 2.  Probable hemorrhoids 3.  Chronic GERD 4.  Intermittent dysphagia.  Rule out peptic stricture     PLAN:   1.  Reflux precautions 2.  Continue PPI 3.  Schedule upper endoscopy with possible esophageal dilation.The nature of the procedure, as well as the risks, benefits, and alternatives were carefully and thoroughly reviewed with the patient. Ample time for discussion and questions allowed. The patient understood, was satisfied, and agreed to proceed. 4.  Schedule colonoscopy to evaluate change in bowel habits.The nature of the procedure, as well as the risks, benefits, and alternatives were carefully and thoroughly reviewed with the patient. Ample time for discussion and questions allowed. The patient understood, was satisfied, and agreed to proceed. 5.  Further recommendations after the above.  Patient presents today for colonoscopy and upper endoscopy to evaluate change in  bowel habits/colon cancer screening and chronic GERD with intermittent dysphagia.  No interval change in history.  No clinical change.  However, noted to be in A-fib.  He does have a history of A-fib for which she was previously hospitalized.  He is asymptomatic with normal blood pressure.  As such, we will proceed.  He will need to follow-up with his PCP or cardiologist regarding A-fib.

## 2023-01-22 NOTE — Progress Notes (Signed)
Pt's has denied chest pain and SOB while in the RR.

## 2023-01-22 NOTE — Progress Notes (Signed)
A and O x3. Report to RN. Tolerated MAC anesthesia well.Teeth unchanged after procedure. 

## 2023-01-23 ENCOUNTER — Telehealth: Payer: Self-pay

## 2023-01-23 NOTE — Telephone Encounter (Signed)
Attempted f/u call. No answer, left VM. 

## 2023-01-26 ENCOUNTER — Encounter: Payer: Self-pay | Admitting: Internal Medicine

## 2023-02-25 ENCOUNTER — Observation Stay (HOSPITAL_COMMUNITY)
Admission: EM | Admit: 2023-02-25 | Discharge: 2023-02-26 | Disposition: A | Discharge status: Home / Self Care | Payer: Medicare Other | Attending: Internal Medicine | Admitting: Internal Medicine

## 2023-02-25 ENCOUNTER — Ambulatory Visit (INDEPENDENT_AMBULATORY_CARE_PROVIDER_SITE_OTHER): Payer: Medicare Other | Admitting: Internal Medicine

## 2023-02-25 ENCOUNTER — Encounter (HOSPITAL_COMMUNITY): Payer: Self-pay | Admitting: *Deleted

## 2023-02-25 ENCOUNTER — Encounter: Payer: Self-pay | Admitting: Internal Medicine

## 2023-02-25 ENCOUNTER — Emergency Department (HOSPITAL_COMMUNITY): Payer: Medicare Other

## 2023-02-25 ENCOUNTER — Other Ambulatory Visit: Payer: Self-pay

## 2023-02-25 VITALS — BP 148/82 | HR 145 | Ht 68.0 in | Wt 186.2 lb

## 2023-02-25 DIAGNOSIS — I484 Atypical atrial flutter: Secondary | ICD-10-CM | POA: Insufficient documentation

## 2023-02-25 DIAGNOSIS — I4891 Unspecified atrial fibrillation: Secondary | ICD-10-CM | POA: Insufficient documentation

## 2023-02-25 DIAGNOSIS — R0602 Shortness of breath: Secondary | ICD-10-CM | POA: Insufficient documentation

## 2023-02-25 DIAGNOSIS — E785 Hyperlipidemia, unspecified: Secondary | ICD-10-CM | POA: Insufficient documentation

## 2023-02-25 DIAGNOSIS — M799 Soft tissue disorder, unspecified: Secondary | ICD-10-CM | POA: Diagnosis not present

## 2023-02-25 DIAGNOSIS — E7849 Other hyperlipidemia: Secondary | ICD-10-CM | POA: Diagnosis not present

## 2023-02-25 DIAGNOSIS — Z79899 Other long term (current) drug therapy: Secondary | ICD-10-CM | POA: Diagnosis not present

## 2023-02-25 DIAGNOSIS — I10 Essential (primary) hypertension: Secondary | ICD-10-CM | POA: Diagnosis not present

## 2023-02-25 DIAGNOSIS — M7989 Other specified soft tissue disorders: Secondary | ICD-10-CM | POA: Diagnosis not present

## 2023-02-25 DIAGNOSIS — I4892 Unspecified atrial flutter: Secondary | ICD-10-CM | POA: Insufficient documentation

## 2023-02-25 LAB — COMPREHENSIVE METABOLIC PANEL
ALT: 28 U/L (ref 0–44)
AST: 22 U/L (ref 15–41)
Albumin: 4 g/dL (ref 3.5–5.0)
Alkaline Phosphatase: 72 U/L (ref 38–126)
Anion gap: 7 (ref 5–15)
BUN: 21 mg/dL (ref 8–23)
CO2: 23 mmol/L (ref 22–32)
Calcium: 9.2 mg/dL (ref 8.9–10.3)
Chloride: 109 mmol/L (ref 98–111)
Creatinine, Ser: 0.66 mg/dL (ref 0.61–1.24)
GFR, Estimated: >60 mL/min (ref >60)
Glucose, Bld: 99 mg/dL (ref 70–99)
Potassium: 3.5 mmol/L (ref 3.5–5.1)
Sodium: 139 mmol/L (ref 135–145)
Total Bilirubin: 1.3 mg/dL — ABNORMAL HIGH (ref 0.3–1.2)
Total Protein: 6.5 g/dL (ref 6.5–8.1)

## 2023-02-25 LAB — CBC WITH DIFFERENTIAL/PLATELET
Abs Immature Granulocytes: 0.02 10*3/uL (ref 0.00–0.07)
Basophils Absolute: 0.1 10*3/uL (ref 0.0–0.1)
Basophils Relative: 1 %
Eosinophils Absolute: 0.2 10*3/uL (ref 0.0–0.5)
Eosinophils Relative: 3 %
HCT: 41.8 % (ref 39.0–52.0)
Hemoglobin: 13.9 g/dL (ref 13.0–17.0)
Immature Granulocytes: 0 %
Lymphocytes Relative: 21 %
Lymphs Abs: 1.5 10*3/uL (ref 0.7–4.0)
MCH: 30.2 pg (ref 26.0–34.0)
MCHC: 33.3 g/dL (ref 30.0–36.0)
MCV: 90.7 fL (ref 80.0–100.0)
Monocytes Absolute: 0.6 10*3/uL (ref 0.1–1.0)
Monocytes Relative: 9 %
Neutro Abs: 4.6 10*3/uL (ref 1.7–7.7)
Neutrophils Relative %: 66 %
Platelets: 106 10*3/uL — ABNORMAL LOW (ref 150–400)
RBC: 4.61 MIL/uL (ref 4.22–5.81)
RDW: 14.4 % (ref 11.5–15.5)
WBC: 6.9 10*3/uL (ref 4.0–10.5)
nRBC: 0 % (ref 0.0–0.2)

## 2023-02-25 LAB — TROPONIN I (HIGH SENSITIVITY)
Troponin I (High Sensitivity): 7 ng/L (ref <18)
Troponin I (High Sensitivity): 7 ng/L (ref <18)

## 2023-02-25 LAB — BRAIN NATRIURETIC PEPTIDE: B Natriuretic Peptide: 144 pg/mL — ABNORMAL HIGH (ref 0.0–100.0)

## 2023-02-25 LAB — TSH: TSH: 0.942 u[IU]/mL (ref 0.350–4.500)

## 2023-02-25 LAB — MAGNESIUM: Magnesium: 2 mg/dL (ref 1.7–2.4)

## 2023-02-25 LAB — MRSA NEXT GEN BY PCR, NASAL: MRSA by PCR Next Gen: NOT DETECTED

## 2023-02-25 MED ORDER — PANTOPRAZOLE SODIUM 40 MG PO TBEC
40.0000 mg | DELAYED_RELEASE_TABLET | Freq: Every day | ORAL | Status: DC
  Administered 2023-02-26: 40 mg via ORAL
  Filled 2023-02-25: qty 1

## 2023-02-25 MED ORDER — DILTIAZEM HCL-DEXTROSE 125-5 MG/125ML-% IV SOLN (PREMIX)
5.0000 mg/h | INTRAVENOUS | Status: DC
  Administered 2023-02-25: 5 mg/h via INTRAVENOUS
  Filled 2023-02-25: qty 125

## 2023-02-25 MED ORDER — ACETAMINOPHEN 325 MG PO TABS: 650.0000 mg | ORAL_TABLET | Freq: Four times a day (QID) | ORAL | Status: DC | PRN

## 2023-02-25 MED ORDER — ONDANSETRON HCL 4 MG/2ML IJ SOLN: 4.0000 mg | Freq: Four times a day (QID) | INTRAMUSCULAR | Status: DC | PRN

## 2023-02-25 MED ORDER — ACETAMINOPHEN 650 MG RE SUPP: 650.0000 mg | Freq: Four times a day (QID) | RECTAL | Status: DC | PRN

## 2023-02-25 MED ORDER — DILTIAZEM LOAD VIA INFUSION
15.0000 mg | Freq: Once | INTRAVENOUS | Status: AC
  Administered 2023-02-25: 15 mg via INTRAVENOUS

## 2023-02-25 MED ORDER — HEPARIN BOLUS VIA INFUSION
3500.0000 [IU] | Freq: Once | INTRAVENOUS | Status: AC
  Administered 2023-02-25: 3500 [IU] via INTRAVENOUS

## 2023-02-25 MED ORDER — POLYETHYLENE GLYCOL 3350 17 G PO PACK: 17.0000 g | PACK | Freq: Every day | ORAL | Status: DC | PRN

## 2023-02-25 MED ORDER — ATORVASTATIN CALCIUM 20 MG PO TABS
20.0000 mg | ORAL_TABLET | Freq: Every day | ORAL | Status: DC
  Administered 2023-02-26: 20 mg via ORAL
  Filled 2023-02-25: qty 1

## 2023-02-25 MED ORDER — ONDANSETRON HCL 4 MG PO TABS: 4.0000 mg | ORAL_TABLET | Freq: Four times a day (QID) | ORAL | Status: DC | PRN

## 2023-02-25 MED ORDER — HEPARIN (PORCINE) 25000 UT/250ML-% IV SOLN
1200.0000 [IU]/h | INTRAVENOUS | Status: DC
  Administered 2023-02-25: 1200 [IU]/h via INTRAVENOUS
  Filled 2023-02-25: qty 250

## 2023-02-25 MED ORDER — CHLORHEXIDINE GLUCONATE CLOTH 2 % EX PADS
6.0000 | MEDICATED_PAD | Freq: Every day | CUTANEOUS | Status: DC
  Administered 2023-02-25 – 2023-02-26 (×2): 6 via TOPICAL

## 2023-02-25 NOTE — Assessment & Plan Note (Signed)
Bilateral lower extremity swelling over the past month, setting of atrial fibrillation.  Reports outpatient lower extremity venous Doppler negative for DVT.  I do not see reports.  BNP 144, no prior to compare. -Obtain records -Hold home Lasix 20 mg as needed for now, to allow for titration of rate limiting meds. - F/u echo.

## 2023-02-25 NOTE — Assessment & Plan Note (Addendum)
Rates up to 130s here in ED, up to 145 in cardiology office.  Diagnosed with atrial fibrillation 3/26 when he underwent colonoscopy.  History of atrial for with RVR in 2016.  Not on anticoagulation.  No history of strokes.  No History of GI bleed or falls.  Potassium 3.4, magnesium 2. - CHADs2Vasc- of at least 2 for age and history of hypertension. -By cardiology as outpatient, recommended Cardizem drip, inpatient cardiology team to see in a.m. -Continue IV heparin - 15 mg bolus of Cardizem given, continue drip -Check TSH -Echocardiogram

## 2023-02-25 NOTE — ED Provider Notes (Signed)
[place] EMERGENCY DEPARTMENT AT [facility] Provider Note   CSN: [id number] Arrival date & time: 02/25/23  1541     History  Chief Complaint  Patient presents with   Atrial Fibrillation    Naythan Douthit is a 72 y.o. male.  HPI 72 year old male presents from the cardiology office with A-fib with RVR.  Patient's wife and the patient provide history.  He is feeling well and at baseline.  March 26 he had a colonoscopy and at that time was noted to be in A-fib.  He was referred to cardiology and was supposed to see them in a few days but an opening opened up today.  Patient is currently asymptomatic denying fatigue, chest pain, shortness of breath, dizziness, lightheadedness.  He has been dealing with about a month or so of leg swelling, left worse than right, but he also dropped a box on his left leg causing trauma and swelling.  He has been on as needed Lasix and he feels like the leg swelling is a little better.  He states he had a history of A-fib several years ago and required overnight admission and then he has not had any issues with A-fib ever since.  He does not feel any palpitations currently.  Home Medications Prior to Admission medications   Medication Sig Start Date End Date Taking? Authorizing Provider  atorvastatin (LIPITOR) 20 MG tablet Take 20 mg by mouth daily. 12/06/22   [provider]  CALCIUM PO Take 1 tablet by mouth daily.    [provider]  fluocinonide cream (LIDEX) 0.05 % Apply 1 Application topically 2 (two) times daily.    [provider]  furosemide (LASIX) 20 MG tablet Take 20 mg by mouth daily. 12/10/22   [provider]  lisinopril (ZESTRIL) 20 MG tablet Take 20 mg by mouth daily. 12/07/22   [provider]  Multiple Vitamin (MULTIVITAMIN WITH MINERALS) TABS tablet Take 1 tablet by mouth daily.    [provider]  omeprazole (PRILOSEC) 20 MG capsule Take 20 mg by mouth daily.    [provider]      Allergies    Patient has no known allergies.    Review of Systems   Review of Systems  Respiratory:  Negative for shortness of breath.   Cardiovascular:  Positive for leg swelling. Negative for chest pain and palpitations.    Physical Exam Updated Vital Signs BP (!) 142/83 (BP Location: Right Arm)   Pulse 98   Temp 98.1 F (36.7 C) (Oral)   Resp 18   Ht 5\' 8"  (1.727 m)   SpO2 97%   BMI 28.31 kg/m  Physical Exam Vitals and nursing note reviewed.  Constitutional:      General: He is not in acute distress.    Appearance: He is well-developed. He is not ill-appearing or diaphoretic.  HENT:     Head: Normocephalic and atraumatic.  Cardiovascular:     Rate and Rhythm: Tachycardia present. Rhythm irregular.     Heart sounds: Normal heart sounds.     Comments: Afib with RVR, HR ~ 140 Pulmonary:     Effort: Pulmonary effort is normal.     Breath sounds: Normal breath sounds.  Abdominal:     General: There is no distension.  Musculoskeletal:     Right lower leg: Edema present.     Left lower leg: Edema present.     Comments: Bilateral lower extremity edema, mildly worse on left. Some mild bruising  in left shin  Skin:    General: Skin is warm and dry.  Neurological:     Mental Status: He is alert.     ED Results / Procedures / Treatments   Labs (all labs ordered are listed, but only abnormal results are displayed) Labs Reviewed  COMPREHENSIVE METABOLIC PANEL - Abnormal; Notable for the following components:      Result Value   Total Bilirubin 1.3 (*)    All other components within normal limits  BRAIN NATRIURETIC PEPTIDE - Abnormal; Notable for the following components:   B Natriuretic Peptide 144.0 (*)    All other components within normal limits  CBC WITH DIFFERENTIAL/PLATELET - Abnormal; Notable for the following components:   Platelets 106 (*)    All other components within normal limits  MRSA NEXT GEN BY PCR, NASAL  MAGNESIUM  TSH  CBC   HEPARIN LEVEL (UNFRACTIONATED)  BASIC METABOLIC PANEL  TROPONIN I (HIGH SENSITIVITY)  TROPONIN I (HIGH SENSITIVITY)    EKG EKG Interpretation  Date/Time:  Monday February 25 2023 15:56:20 EDT Ventricular Rate:  136 PR Interval:    QRS Duration: 84 QT Interval:  296 QTC Calculation: 445 R Axis:   93 Text Interpretation: Atrial fibrillation with rapid ventricular response Rightward axis Low voltage QRS Nonspecific ST abnormality  afib new since 2016 Confirmed by Pricilla Loveless [phone]) on 02/25/2023 4:00:42 PM  Radiology DG Chest Portable 1 View  Result Date: 02/25/2023 CLINICAL DATA:  Atrial fibrillation with rapid ventricular response EXAM: PORTABLE CHEST 1 VIEW COMPARISON:  01/15/2015 FINDINGS: 2 frontal views of the chest demonstrate an unremarkable cardiac silhouette. No acute airspace disease, effusion, or pneumothorax. There are no acute displaced fractures. IMPRESSION: 1. No acute intrathoracic process. Electronically Signed   By: Sharlet Salina M.D.   On: 02/25/2023 16:51    Procedures .Critical Care  Performed by: Pricilla Loveless, MD Authorized by: Pricilla Loveless, MD   Critical care provider statement:    Critical care time (minutes):  35   Critical care time was exclusive of:  Separately billable procedures and treating other patients   Critical care was necessary to treat or prevent imminent or life-threatening deterioration of the following conditions:  Cardiac failure   Critical care was time spent personally by me on the following activities:  Development of treatment plan with patient or surrogate, discussions with consultants, evaluation of patient's response to treatment, examination of patient, ordering and review of laboratory studies, ordering and review of radiographic studies, ordering and performing treatments and interventions, pulse oximetry, re-evaluation of patient's condition and review of old charts     Medications Ordered in ED Medications - No data to  display  ED Course/ Medical Decision Making/ A&P         CHA2DS2-VASc Score: 2                    Medical Decision Making Amount and/or Complexity of Data Reviewed Labs: ordered.    Details: No significant electrolyte disturbance.  Mildly elevated BNP. Radiology: ordered and independent interpretation performed.    Details: No CHF ECG/medicine tests: ordered and independent interpretation performed.    Details: Fib with RVR  Risk Decision regarding hospitalization.   Patient presents with A-fib with RVR.  He has been in A-fib for about a month though he has been completely asymptomatic.  However his heart rate is in the 140-150 range.  Started on diltiazem.  Also discussed with his outpatient cardiologist, Dr. Jenene Slicker.  She advises heparin  as well.  As above his [place] Vasc score is 2.  He had good rate control with IV diltiazem bolus and drip and will need admission.  Discussed with Dr. Mariea Clonts.         Final Clinical Impression(s) / ED Diagnoses Final diagnoses:  Atrial fibrillation with RVR [facility])    Rx / DC Orders ED Discharge Orders     None         Pricilla Loveless, MD 02/25/23 2340

## 2023-02-25 NOTE — H&P (Signed)
History and Physical    Ronald Rivas ZOX:[id number] DOB: Mar 17, 1951 DOA: 02/25/2023  PCP: Patient, No Pcp Per   Patient coming from: Home  I have personally briefly reviewed patient's old medical records in [facility]  Chief Complaint: Atria Fib  HPI: Ronald Rivas is a 72 y.o. male with medical history significant for Atria Fib, HTN, neuropathy.  Patient was sent to the ED from cardiology office for atrial fibrillation with RVR, rates up to 145.  Patient had a colonoscopy= EGD 01/22/2023, he was found to be in atrial fibrillation, was told to follow-up with cardiology, he was unable to get an appointment until today.  He denies palpitations, no chest pain, no difficulty breathing, no dizziness. Patient reports some bilateral lower extremity swelling left worse than right over the past month, for which he had an outpatient ultrasound which was negative for DVT and started on 20 mg Lasix prn, which has helped improved the swelling. No history of strokes.  He is very active at baseline.  EKG from clinic showing atrial fibrillation with rate of 151.  ED Course: Heart rate in the 130s.  Temp 98.1.  Blood pressure systolic 111-159.  Magnesium 2.  Potassium 3.5.   Chest X-ray negative for acute abnormality. 15 mg bolus Cardizem and Cardizem drip started, heparin drip started.  Hospitalist to admit.  Review of Systems: As per HPI all other systems reviewed and negative.  Past Medical History:  Diagnosis Date   atrial fibrillation with rvr 2016   GERD (gastroesophageal reflux disease)    Hypertension    Neuropathy     Past Surgical History:  Procedure Laterality Date   BACK SURGERY     COLONOSCOPY     HAND SURGERY     72yo   HERNIA REPAIR       reports that he has never smoked. He does not have any smokeless tobacco history on file. He reports that he does not drink alcohol and does not use drugs.  No Known Allergies  Family History  Problem Relation Age of Onset   Colon  cancer Neg Hx    Stomach cancer Neg Hx    Esophageal cancer Neg Hx    Rectal cancer Neg Hx    Prior to Admission medications   Medication Sig Start Date End Date Taking? Authorizing Provider  atorvastatin (LIPITOR) 20 MG tablet Take 20 mg by mouth daily. 12/06/22  Yes [provider]  furosemide (LASIX) 20 MG tablet Take 20 mg by mouth as needed for fluid. 12/10/22  Yes [provider]  lisinopril (ZESTRIL) 20 MG tablet Take 20 mg by mouth daily. 12/07/22  Yes [provider]  omeprazole (PRILOSEC) 20 MG capsule Take 20 mg by mouth daily.   Yes [provider]  Multiple Vitamin (MULTIVITAMIN WITH MINERALS) TABS tablet Take 1 tablet by mouth daily. Patient not taking: Reported on 02/25/2023    [provider]    Physical Exam: Vitals:   02/25/23 1553 02/25/23 1700 02/25/23 1715 02/25/23 1730  BP:  (!) 141/84 (!) 159/139 113/71  Pulse:  (!) 59  69  Resp:  17 14 16   Temp:      TempSrc:      SpO2:  96%  99%  Height: 5\' 8"  (1.727 m)       Constitutional: NAD, calm, comfortable Vitals:   02/25/23 1553 02/25/23 1700 02/25/23 1715 02/25/23 1730  BP:  (!) 141/84 (!) 159/139 113/71  Pulse:  (!) 59  69  Resp:  17 14 16   Temp:      TempSrc:      SpO2:  96%  99%  Height: 5\' 8"  (1.727 m)      Eyes: PERRL, lids and conjunctivae normal ENMT: Mucous membranes are moist.   Neck: normal, supple, no masses, no thyromegaly Respiratory: clear to auscultation bilaterally, no wheezing, no crackles. Normal respiratory effort. No accessory muscle use.  Cardiovascular: Tachycardic, irregular rate and rhythm, no murmurs / rubs / gallops.  Trace to 1+ pitting bilateral lower extremity edema to just above ankles L > R..  Extremities warm.  Abdomen: no tenderness, no masses palpated. No hepatosplenomegaly. Bowel sounds positive.  Musculoskeletal: no clubbing / cyanosis. No joint deformity upper and lower extremities.  Skin: no rashes, lesions, ulcers. No  induration Neurologic: No facial asymmetry, 4+ / 5 strength in all extremities, speech clear without aphasia Psychiatric: Normal judgment and insight. Alert and oriented x 3. Normal mood.   Labs on Admission: I have personally reviewed following labs and imaging studies  CBC: Recent Labs  Lab 02/25/23 1626  WBC 6.9  NEUTROABS 4.6  HGB 13.9  HCT 41.8  MCV 90.7  PLT 106*   Basic Metabolic Panel: Recent Labs  Lab 02/25/23 1626  NA 139  K 3.5  CL 109  CO2 23  GLUCOSE 99  BUN 21  CREATININE 0.66  CALCIUM 9.2  MG 2.0   GFR: Estimated Creatinine Clearance: 88.3 mL/min (by C-G formula based on SCr of 0.66 mg/dL). Liver Function Tests: Recent Labs  Lab 02/25/23 1626  AST 22  ALT 28  ALKPHOS 72  BILITOT 1.3*  PROT 6.5  ALBUMIN 4.0   Radiological Exams on Admission: DG Chest Portable 1 View  Result Date: 02/25/2023 CLINICAL DATA:  Atrial fibrillation with rapid ventricular response EXAM: PORTABLE CHEST 1 VIEW COMPARISON:  01/15/2015 FINDINGS: 2 frontal views of the chest demonstrate an unremarkable cardiac silhouette. No acute airspace disease, effusion, or pneumothorax. There are no acute displaced fractures. IMPRESSION: 1. No acute intrathoracic process. Electronically Signed   By: Sharlet Salina M.D.   On: 02/25/2023 16:51    EKG: Independently reviewed.  Atrial fibrillation, rate 151.  No significant ST or T wave abnormality.  Assessment/Plan Principal Problem:   Atrial fibrillation with RVR (HCC) Active Problems:   Hypertension   Swelling of lower extremity   Assessment and Plan: * Atrial fibrillation with RVR (HCC) Rates up to 130s here in ED, up to 145 in cardiology office.  Diagnosed with atrial fibrillation 3/26 when he underwent colonoscopy.  History of atrial for with RVR in 2016.  Not on anticoagulation.  No history of strokes.  No History of GI bleed or falls.  Potassium 3.4, magnesium 2. - CHADs2Vasc- of at least 2 for age and history of  hypertension. -By cardiology as outpatient, recommended Cardizem drip, inpatient cardiology team to see in a.m. -Continue IV heparin - 15 mg bolus of Cardizem given, continue drip -Check TSH -Echocardiogram  Swelling of lower extremity Bilateral lower extremity swelling over the past month, setting of atrial fibrillation.  Reports outpatient lower extremity venous Doppler negative for DVT.  I do not see reports.  BNP 144, no prior to compare. -Obtain records -Hold home Lasix 20 mg as needed for now, to allow for titration of rate limiting meds. - F/u echo.  Hypertension Stable. -Hold lisinopril 20 mg daily to allow for titration of rate limiting meds   DVT prophylaxis: Heparin Code Status: Full Code, confirmed with patient at bedside  Family Communication: None at bedside.   Disposition Plan: ~ 2 days Consults called: Cardiology Admission status: Inpt stepdown I certify that at the point of admission it is my clinical judgment that the patient will require inpatient hospital care spanning beyond 2 midnights from the point of admission due to high intensity of service, high risk for further deterioration and high frequency of surveillance required.  Author: Onnie Boer, MD 02/25/2023 9:10 PM  For on call review [email or web address].

## 2023-02-25 NOTE — ED Triage Notes (Signed)
Pt sent from heartcare for Afib with RVR.  Denies taking any blood thinners.

## 2023-02-25 NOTE — Assessment & Plan Note (Signed)
Stable. -Hold lisinopril 20 mg daily to allow for titration of rate limiting meds

## 2023-02-25 NOTE — Progress Notes (Signed)
ANTICOAGULATION CONSULT NOTE  Pharmacy Consult for Heparin Indication: atrial fibrillation  No Known Allergies  Patient Measurements: Height: 5\' 8"  (172.7 cm) IBW/kg (Calculated) : 68.4 Heparin Dosing Weight: 85 kg  Vital Signs: Temp: 98.1 F (36.7 C) (04/29 1552) Temp Source: Oral (04/29 1552) BP: 142/83 (04/29 1552) Pulse Rate: 98 (04/29 1552)  Labs: Recent Labs    02/25/23 1626  HGB 13.9  HCT 41.8  PLT 106*  CREATININE 0.66  TROPONINIHS 7    Estimated Creatinine Clearance: 88.3 mL/min (by C-G formula based on SCr of 0.66 mg/dL).   Medical History: Past Medical History:  Diagnosis Date   atrial fibrillation with rvr 2016   GERD (gastroesophageal reflux disease)    Hypertension    Neuropathy     Assessment: 72 YO male with medical history significant for HTN and GERD who is admitted with new onset atrial fibrillation. Patient not on anticoagulation PTA. H/h stable, platelets chronically low stable at 106.   Goal of Therapy:  Heparin level 0.3-0.7 units/ml Monitor platelets by anticoagulation protocol: Yes   Plan:  Give heparin IV bolus 3500 units x1 Start heparin infusion at 1200 units/hr Check heparin level in 8 hours and daily while on heparin Continue to monitor H&H and platelets  Thank you for allowing pharmacy to be a part of this patient's care.  Thelma Barge, PharmD Clinical Pharmacist

## 2023-02-25 NOTE — ED Notes (Signed)
Report called to [employer]

## 2023-02-25 NOTE — Patient Instructions (Addendum)
Medication Instructions:  Your physician recommends that you continue on your current medications as directed. Please refer to the Current Medication list given to you today.  *If you need a refill on your cardiac medications before your next appointment, please call your pharmacy*   Lab Work: None If you have labs (blood work) drawn today and your tests are completely normal, you will receive your results only by: MyChart Message (if you have MyChart) OR A paper copy in the mail If you have any lab test that is abnormal or we need to change your treatment, we will call you to review the results.   Testing/Procedures: None   Follow-Up: At [facility], you and your health needs are our priority.  As part of our continuing mission to provide you with exceptional heart care, we have created designated Provider Care Teams.  These Care Teams include your primary Cardiologist (physician) and Advanced Practice Providers (APPs -  Physician Assistants and Nurse Practitioners) who all work together to provide you with the care you need, when you need it.  We recommend signing up for the patient portal called "MyChart".  Sign up information is provided on this After Visit Summary.  MyChart is used to connect with patients for Virtual Visits (Telemedicine).  Patients are able to view lab/test results, encounter notes, upcoming appointments, etc.  Non-urgent messages can be sent to your provider as well.   To learn more about what you can do with MyChart, go to [email or web address].    Your next appointment:   1 month(s) with Randall An, PA-C, Sharlene Dory, NP  6 months with Dr. Jenene Slicker Other Instructions

## 2023-02-25 NOTE — Progress Notes (Signed)
Cardiology Office Note  Date: 02/25/2023   ID: Cleo Villamizar, DOB August 24, 1951, MRN [id number]  PCP:  Patient, No Pcp Per  Cardiologist:  Marjo Bicker, MD Electrophysiologist:  None   Reason for Office Visit: Evaluation of HTN (self-referral)   History of Present Illness: Jackson Coffield is a 72 y.o. male known to have HTN, GERD self-referred to cardiology clinic for evaluation of elevated heart rates.  Patient underwent a colonoscopy in [place] last month when he was noted to have new onset of ? Atrial flutter versus atrial fibrillation with RVR (EKG had a artifact at baseline but appeared to have multiple P waves in the V1 making it atrial flutter) after he was placed on the table. Anesthesia administered IV medication to control heart rate and he underwent colonoscopy. He does not check his blood pressure or heart rate at home and does not know if he has been in RVR all this while.  He has no symptoms of atrial flutter with RVR, denied any SOB, angina, dizziness, fatigue and palpitations.He was admitted in 2016 with A-fib with RVR and rate controlled with IV drip.  He was not placed on any systemic anticoagulation in 2016. He did not have any prior cardiology follow-up visit either. Today in the clinic, his heart rate is 145 with EKG showing atypical atrial flutter with RVR and he continues to have no symptoms.  Past Medical History:  Diagnosis Date   atrial fibrillation with rvr 2016   GERD (gastroesophageal reflux disease)    Hypertension    Neuropathy     Past Surgical History:  Procedure Laterality Date   BACK SURGERY     COLONOSCOPY     HAND SURGERY     72yo   HERNIA REPAIR      Current Outpatient Medications  Medication Sig Dispense Refill   atorvastatin (LIPITOR) 20 MG tablet Take 20 mg by mouth daily.     CALCIUM PO Take 1 tablet by mouth daily.     fluocinonide cream (LIDEX) 0.05 % Apply 1 Application topically 2 (two) times daily.     furosemide (LASIX) 20  MG tablet Take 20 mg by mouth daily.     lisinopril (ZESTRIL) 20 MG tablet Take 20 mg by mouth daily.     Multiple Vitamin (MULTIVITAMIN WITH MINERALS) TABS tablet Take 1 tablet by mouth daily.     omeprazole (PRILOSEC) 20 MG capsule Take 20 mg by mouth daily.     No current facility-administered medications for this visit.   Allergies:  Patient has no known allergies.   Social History: The patient  reports that he has never smoked. He does not have any smokeless tobacco history on file. He reports that he does not drink alcohol and does not use drugs.   Family History: The patient's family history is not on file.   ROS:  Please see the history of present illness. Otherwise, complete review of systems is positive for none.  All other systems are reviewed and negative.   Physical Exam: VS:  BP (!) 148/82   Pulse (!) 145   Ht 5\' 8"  (1.727 m)   Wt 186 lb 3.2 oz (84.5 kg)   SpO2 96%   BMI 28.31 kg/m , BMI Body mass index is 28.31 kg/m.  Wt Readings from Last 3 Encounters:  02/25/23 186 lb 3.2 oz (84.5 kg)  12/12/22 189 lb (85.7 kg)  01/15/15 184 lb (83.5 kg)    General: Patient appears comfortable at rest. HEENT: Conjunctiva  and lids normal, oropharynx clear with moist mucosa. Neck: Supple, no elevated JVP or carotid bruits, no thyromegaly. Lungs: Clear to auscultation, nonlabored breathing at rest. Cardiac: Regular rate and rhythm, no S3 or significant systolic murmur, no pericardial rub. Abdomen: Soft, nontender, no hepatomegaly, bowel sounds present, no guarding or rebound. Extremities: No pitting edema, distal pulses 2+. Skin: Warm and dry. Musculoskeletal: No kyphosis. Neuropsychiatric: Alert and oriented x3, affect grossly appropriate.  Recent Labwork: No results found for requested labs within last 365 days.  No results found for: "CHOL", "TRIG", "HDL", "CHOLHDL", "VLDL", "LDLCALC", "LDLDIRECT"  Other Studies Reviewed Today: I personally reviewed the EKG strips from  01/22/2023 which the patient brought with him.  Assessment and Plan: Patient is a 72 year old M known to have HTN, GERD self referred to cardiology clinic for evaluation of elevated heart rates.  # Atypical atrial flutter with RVR -EKG today showed atypical atrial flutter with RVR with HR 145 BPM.  I reviewed the EKGs from 01/22/2023 which the patient brought with him that showed A-fib versus atrial flutter with RVR with HR 151 bpm. He will benefit from ER visit and hospital admission for IV drip.  Okay to give IV diltiazem push followed by diltiazem drip.  Will obtain 2D echocardiogram after heart rates are better controlled. If the LVEF is less than 40%, need to switch diltiazem to beta-blocker.  Start heparin drip. I spoke with the ER attending, Dr. Pricilla Loveless.The inpatient cardiology team is aware and will see the patient tomorrow. -Obtain TSH and outpatient OSA ablation (no symptoms of OSA although).  # HTN: Will hold off on home antihypertensive medications to allow room for IV rate controlling agents. # HLD: Continue atorvastatin 20 mg at bedtime.   I have spent a total of 60 minutes with patient reviewing chart, EKGs, labs and examining patient as well as establishing an assessment and plan that was discussed with the patient.  > 50% of time was spent in direct patient care.    Medication Adjustments/Labs and Tests Ordered: Current medicines are reviewed at length with the patient today.  Concerns regarding medicines are outlined above.   Tests Ordered: Orders Placed This Encounter  Procedures   EKG 12-Lead    Medication Changes: No orders of the defined types were placed in this encounter.   Disposition:  Follow up  1 month with APP and 6 months with me  Signed, Madesyn Ast Verne Spurr, MD, 02/25/2023 3:05 PM    [facility] at [facility] [address], [place], [place] [address]

## 2023-02-26 ENCOUNTER — Other Ambulatory Visit (HOSPITAL_COMMUNITY): Payer: Self-pay | Admitting: *Deleted

## 2023-02-26 ENCOUNTER — Other Ambulatory Visit (HOSPITAL_COMMUNITY): Payer: Self-pay

## 2023-02-26 ENCOUNTER — Observation Stay (HOSPITAL_BASED_OUTPATIENT_CLINIC_OR_DEPARTMENT_OTHER): Payer: Medicare Other

## 2023-02-26 DIAGNOSIS — I4892 Unspecified atrial flutter: Secondary | ICD-10-CM | POA: Diagnosis not present

## 2023-02-26 DIAGNOSIS — I4891 Unspecified atrial fibrillation: Secondary | ICD-10-CM

## 2023-02-26 LAB — CBC
HCT: 41.2 % (ref 39.0–52.0)
Hemoglobin: 13.9 g/dL (ref 13.0–17.0)
MCH: 30.3 pg (ref 26.0–34.0)
MCHC: 33.7 g/dL (ref 30.0–36.0)
MCV: 90 fL (ref 80.0–100.0)
Platelets: 102 10*3/uL — ABNORMAL LOW (ref 150–400)
RBC: 4.58 MIL/uL (ref 4.22–5.81)
RDW: 14.3 % (ref 11.5–15.5)
WBC: 5.7 10*3/uL (ref 4.0–10.5)
nRBC: 0 % (ref 0.0–0.2)

## 2023-02-26 LAB — ECHOCARDIOGRAM COMPLETE
Area-P 1/2: 3.37 cm2
Height: 68 in
MV M vel: 4.65 m/s
MV Peak grad: 86.5 mmHg
P 1/2 time: 658 msec
S' Lateral: 3.2 cm
Weight: 2934.76 oz

## 2023-02-26 LAB — MAGNESIUM: Magnesium: 1.9 mg/dL (ref 1.7–2.4)

## 2023-02-26 LAB — BASIC METABOLIC PANEL
Anion gap: 6 (ref 5–15)
BUN: 16 mg/dL (ref 8–23)
CO2: 25 mmol/L (ref 22–32)
Calcium: 8.7 mg/dL — ABNORMAL LOW (ref 8.9–10.3)
Chloride: 108 mmol/L (ref 98–111)
Creatinine, Ser: 0.55 mg/dL — ABNORMAL LOW (ref 0.61–1.24)
GFR, Estimated: >60 mL/min (ref >60)
Glucose, Bld: 93 mg/dL (ref 70–99)
Potassium: 3 mmol/L — ABNORMAL LOW (ref 3.5–5.1)
Sodium: 139 mmol/L (ref 135–145)

## 2023-02-26 LAB — HEPARIN LEVEL (UNFRACTIONATED): Heparin Unfractionated: 0.38 IU/mL (ref 0.30–0.70)

## 2023-02-26 MED ORDER — POTASSIUM CHLORIDE CRYS ER 20 MEQ PO TBCR
40.0000 meq | EXTENDED_RELEASE_TABLET | Freq: Once | ORAL | Status: AC
  Administered 2023-02-26: 40 meq via ORAL
  Filled 2023-02-26: qty 2

## 2023-02-26 MED ORDER — MAGNESIUM SULFATE 2 GM/50ML IV SOLN
2.0000 g | Freq: Once | INTRAVENOUS | Status: AC
  Administered 2023-02-26: 2 g via INTRAVENOUS
  Filled 2023-02-26: qty 50

## 2023-02-26 MED ORDER — APIXABAN 5 MG PO TABS
5.0000 mg | ORAL_TABLET | Freq: Two times a day (BID) | ORAL | Status: DC
  Administered 2023-02-26: 5 mg via ORAL
  Filled 2023-02-26: qty 1

## 2023-02-26 MED ORDER — METOPROLOL TARTRATE 50 MG PO TABS
50.0000 mg | ORAL_TABLET | Freq: Two times a day (BID) | ORAL | Status: DC
  Administered 2023-02-26: 50 mg via ORAL
  Filled 2023-02-26: qty 1

## 2023-02-26 MED ORDER — APIXABAN 5 MG PO TABS: 5.0000 mg | ORAL_TABLET | Freq: Two times a day (BID) | ORAL | 1 refills | Status: DC

## 2023-02-26 MED ORDER — METOPROLOL TARTRATE 50 MG PO TABS: 50.0000 mg | ORAL_TABLET | Freq: Two times a day (BID) | ORAL | 1 refills | Status: DC

## 2023-02-26 MED ORDER — POTASSIUM CHLORIDE CRYS ER 20 MEQ PO TBCR
40.0000 meq | EXTENDED_RELEASE_TABLET | Freq: Four times a day (QID) | ORAL | Status: DC
  Administered 2023-02-26: 40 meq via ORAL
  Filled 2023-02-26: qty 2

## 2023-02-26 NOTE — TOC Benefit Eligibility Note (Signed)
Patient Product/process development scientist completed.    The patient is currently admitted and upon discharge could be taking Eliquis 5 mg.  None of [employer] are contracted with his insurance, unable to do a test claim.  The patient is insured through [employer] Medicare Part D   This test claim was processed through [facility] and [facility]- copay amounts may vary at other pharmacies due to pharmacy/plan contracts, or as the patient moves through the different stages of their insurance plan.  Roland Earl, CPHT Pharmacy Patient Advocate Specialist [facility] Patient Advocate Team Direct Number: [phone]  Fax: [phone]

## 2023-02-26 NOTE — Progress Notes (Signed)
ANTICOAGULATION CONSULT NOTE  Pharmacy Consult for Heparin Indication: atrial fibrillation  No Known Allergies  Patient Measurements: Height: 5\' 8"  (172.7 cm) Weight: 83.2 kg (183 lb 6.8 oz) IBW/kg (Calculated) : 68.4 Heparin Dosing Weight: 85 kg  Vital Signs: Temp: 98.1 F (36.7 C) (04/29 2240) Temp Source: Oral (04/29 2240) BP: 109/73 (04/30 0300) Pulse Rate: 63 (04/30 0300)  Labs: Recent Labs    02/25/23 1626 02/25/23 1843 02/26/23 0208  HGB 13.9  --  13.9  HCT 41.8  --  41.2  PLT 106*  --  102*  HEPARINUNFRC  --   --  0.38  CREATININE 0.66  --  0.55*  TROPONINIHS 7 7  --     Estimated Creatinine Clearance: 87.7 mL/min (A) (by C-G formula based on SCr of 0.55 mg/dL (L)).  Medical History: Past Medical History:  Diagnosis Date   atrial fibrillation with rvr 2016   GERD (gastroesophageal reflux disease)    Hypertension    Neuropathy    Assessment: 72 YO male with medical history significant for HTN and GERD who is admitted with new onset atrial fibrillation. Patient not on anticoagulation PTA.  Initial heparin level therapeutic: 0.38 on 1200 units/hr; no issues with infusion or overt s/sx of bleeding reported. CBC stable with low PLT 102  Goal of Therapy:  Heparin level 0.3-0.7 units/ml Monitor platelets by anticoagulation protocol: Yes   Plan:  Continue heparin infusion at 1200 units/hr Check heparin level in 8 hours and daily while on heparin Continue to monitor H&H and platelets  Ruben Im, PharmD Clinical Pharmacist 02/26/2023 3:41 AM Please check AMION for all [facility] Pharmacy numbers

## 2023-02-26 NOTE — Progress Notes (Signed)
  Transition of Care [facility]) Screening Note   Patient Details  Name: Ronald Rivas Date of Birth: Aug 13, 1951   Transition of Care [facility]) CM/SW Contact:    Villa Herb, LCSWA Phone Number: 02/26/2023, 10:07 AM    Transition of Care Department [facility]) has reviewed patient and no TOC needs have been identified at this time. We will continue to monitor patient advancement through interdisciplinary progression rounds. If new patient transition needs arise, please place a TOC consult.

## 2023-02-26 NOTE — Discharge Instructions (Signed)

## 2023-02-26 NOTE — Hospital Course (Signed)
Ronald Rivas is a 72 y.o. male with medical history significant for Atria Fib, HTN, neuropathy.  Patient was sent to the ED from cardiology office for atrial fibrillation with RVR, rates up to 145.  Patient had a colonoscopy= EGD 01/22/2023, he was found to be in atrial fibrillation, was told to follow-up with cardiology, he was unable to get an appointment until today.  He denies palpitations, no chest pain, no difficulty breathing, no dizziness. Patient reports some bilateral lower extremity swelling left worse than right over the past month, for which he had an outpatient ultrasound which was negative for DVT and started on 20 mg Lasix prn, which has helped improved the swelling. No history of strokes.  He is very active at baseline.   EKG from clinic showing atrial fibrillation with rate of 151.   ED Course: Heart rate in the 130s.  Temp 98.1.  Blood pressure systolic 111-159.  Magnesium 2.  Potassium 3.5.   Chest X-ray negative for acute abnormality. 15 mg bolus Cardizem and Cardizem drip started, heparin drip started.  Hospitalist to admit.

## 2023-02-26 NOTE — Discharge Summary (Addendum)
Physician Discharge Summary   Patient: Ronald Rivas MRN: [id number] DOB: 03-11-1951  Admit date:     02/25/2023  Discharge date: 02/26/23  Discharge Physician: Kendell Bane   PCP: Patient, No Pcp Per   Recommendations at discharge:  -Follow-up with cardiologist Dr. Wyline Mood in 2-4 weeks -Continue currently recommended medication including apixaban,  Discharge Diagnoses: Principal Problem:   Atrial fibrillation with RVR (HCC) Active Problems:   Hypertension   Swelling of lower extremity  Resolved Problems:   * No resolved hospital problems. *  Hospital Course: Ronald Rivas is a 72 y.o. male with medical history significant for Atria Fib, HTN, neuropathy.  Patient was sent to the ED from cardiology office for atrial fibrillation with RVR, rates up to 145.  Patient had a colonoscopy= EGD 01/22/2023, he was found to be in atrial fibrillation, was told to follow-up with cardiology, he was unable to get an appointment until today.  He denies palpitations, no chest pain, no difficulty breathing, no dizziness. Patient reports some bilateral lower extremity swelling left worse than right over the past month, for which he had an outpatient ultrasound which was negative for DVT and started on 20 mg Lasix prn, which has helped improved the swelling. No history of strokes.  He is very active at baseline.   EKG from clinic showing atrial fibrillation with rate of 151.   ED Course: Heart rate in the 130s.  Temp 98.1.  Blood pressure systolic 111-159.  Magnesium 2.  Potassium 3.5.   Chest X-ray negative for acute abnormality. 15 mg bolus Cardizem and Cardizem drip started, heparin drip started.  Hospitalist to admit.   * Atrial fibrillation with RVR (HCC)/atrial flutter -Per cardiology was in flutter now in A-fib, heart rate much improved Rates up to 130s here in ED, up to 145 in cardiology office.  Diagnosed with atrial fibrillation 3/26 when he underwent colonoscopy.  History of atrial for  with RVR in 2016.  Not on anticoagulation.  No history of strokes.  No History of GI bleed or falls.  Potassium 3.4, magnesium 2. - CHADs2Vasc- of at least 2 (for age and history of hypertension)   -Continue IV heparin-discontinued - 15 mg bolus of Cardizem given, continue drip--- heart rate improved, Discontinued Cardiology started patient on metoprolol 50 mg p.o. twice daily  -TSH 0.942 wnl  - Trop 7, 7  -Echocardiogram -reviewed EJF:55 to 60%. The  left ventricle has normal function. The left ventricle has no regional  wall motion abnormalities. Left ventricular diastolic parameters are  indeterminate.   Right ventricular systolic function is normal     Per card. Rate control - at 3 weeks of anticoagulation could consider trial of DCCV in setting of newly diagnosed aflutter.      Swelling of lower extremity Bilateral lower extremity swelling over the past month, setting of atrial fibrillation.   Reports outpatient lower extremity venous Doppler negative for DVT.  BNP 144,   -Home Lasix 20 mg -to be continued - F/u echo... Reviewed as above   Hypertension Stable. -Resume lisinopril 20 mg daily  -Metoprolol 50 mg p.o. twice daily added    Consultants: Cardiologist Dr. Wyline Mood Procedures performed: 2D echocardiogram Disposition: Home Diet recommendation:  Discharge Diet Orders (From admission, onward)     Start     Ordered   02/26/23 0000  Diet - low sodium heart healthy        02/26/23 1153           Cardiac diet DISCHARGE  MEDICATION: Allergies as of 02/26/2023   No Known Allergies      Medication List     TAKE these medications    apixaban 5 MG Tabs tablet Commonly known as: ELIQUIS Take 1 tablet (5 mg total) by mouth 2 (two) times daily.   atorvastatin 20 MG tablet Commonly known as: LIPITOR Take 20 mg by mouth daily.   furosemide 20 MG tablet Commonly known as: LASIX Take 20 mg by mouth as needed for fluid.   lisinopril 20 MG  tablet Commonly known as: ZESTRIL Take 20 mg by mouth daily.   metoprolol tartrate 50 MG tablet Commonly known as: LOPRESSOR Take 1 tablet (50 mg total) by mouth 2 (two) times daily.   multivitamin with minerals Tabs tablet Take 1 tablet by mouth daily.   omeprazole 20 MG capsule Commonly known as: PRILOSEC Take 20 mg by mouth daily.        Discharge Exam: Filed Weights   02/25/23 2030 02/26/23 0353  Weight: 83.2 kg 83.2 kg        General:  AAO x 3,  cooperative, no distress;   HEENT:  Normocephalic, PERRL, otherwise with in Normal limits   Neuro:  CNII-XII intact. , normal motor and sensation, reflexes intact   Lungs:   Clear to auscultation BL, Respirations unlabored,  No wheezes / crackles  Cardio:    S1/S2 irregularly irregular, No murmure, No Rubs or Gallops   Abdomen:  Soft, non-tender, bowel sounds active all four quadrants, no guarding or peritoneal signs.  Muscular  skeletal:  Limited exam -global generalized weaknesses - in bed, able to move all 4 extremities,   2+ pulses,  symmetric, No pitting edema  Skin:  Dry, warm to touch, negative for any Rashes,  Wounds: Please see nursing documentation          Condition at discharge: good  The results of significant diagnostics from this hospitalization (including imaging, microbiology, ancillary and laboratory) are listed below for reference.   Imaging Studies: DG Chest Portable 1 View  Result Date: 02/25/2023 CLINICAL DATA:  Atrial fibrillation with rapid ventricular response EXAM: PORTABLE CHEST 1 VIEW COMPARISON:  01/15/2015 FINDINGS: 2 frontal views of the chest demonstrate an unremarkable cardiac silhouette. No acute airspace disease, effusion, or pneumothorax. There are no acute displaced fractures. IMPRESSION: 1. No acute intrathoracic process. Electronically Signed   By: Sharlet Salina M.D.   On: 02/25/2023 16:51    Microbiology: Results for orders placed or performed during the hospital encounter  of 02/25/23  MRSA Next Gen by PCR, Nasal     Status: None   Collection Time: 02/25/23  9:07 PM   Specimen: Nasal Mucosa; Nasal Swab  Result Value Ref Range Status   MRSA by PCR Next Gen NOT DETECTED NOT DETECTED Final    Comment: (NOTE) The GeneXpert MRSA Assay (FDA approved for NASAL specimens only), is one component of a comprehensive MRSA colonization surveillance program. It is not intended to diagnose MRSA infection nor to guide or monitor treatment for MRSA infections. Test performance is not FDA approved in patients less than 25 years old. Performed at [facility], [address], [place], [place] [address]     Labs: CBC: Recent Labs  Lab 02/25/23 1626 02/26/23 0208  WBC 6.9 5.7  NEUTROABS 4.6  --   HGB 13.9 13.9  HCT 41.8 41.2  MCV 90.7 90.0  PLT 106* 102*   Basic Metabolic Panel: Recent Labs  Lab 02/25/23 1626 02/26/23 0208  NA 139  139  K 3.5 3.0*  CL 109 108  CO2 23 25  GLUCOSE 99 93  BUN 21 16  CREATININE 0.66 0.55*  CALCIUM 9.2 8.7*  MG 2.0 1.9   Liver Function Tests: Recent Labs  Lab 02/25/23 1626  AST 22  ALT 28  ALKPHOS 72  BILITOT 1.3*  PROT 6.5  ALBUMIN 4.0   CBG: No results for input(s): "GLUCAP" in the last 168 hours.  Discharge time spent: greater than 30 minutes.  Signed: Kendell Bane, MD [facility] 02/26/2023

## 2023-02-26 NOTE — Consult Note (Signed)
Cardiology Consultation   Patient ID: Ronald Rivas MRN: [id number]; DOB: 1951-05-05  Admit date: 02/25/2023 Date of Consult: 02/26/2023  PCP:  Patient, No Pcp Per   [facility] Providers Cardiologist:  Ronald Bicker, MD   {      Patient Profile:   Ronald Rivas is a 72 y.o. male with a hx of HTN, afib,  who is being seen 02/26/2023 for the evaluation of tachycardia at the request of Ronald Rivas.  History of Present Illness:   Ronald Rivas 72 yo male history of HTN, GERD, aflutter diagnosed last month during colonscopy. Notes indicate prior history of afib in 2016, however appears was not on anticoagulation since that time. Seen in cardiology clinic yesterday as new patient, found to be in aflutter with RVR, sent to ER for evaluaton and admission He denies any specific palpitations, chest pains, or SOB.    K 3.5 Cr 0.66 BUN 21 BNP 144 Mg 2 TSH 0.94 Trop 7-->7 EKG aflutter with RVR CXR no acute process Echo pending   Past Medical History:  Diagnosis Date   atrial fibrillation with rvr 2016   GERD (gastroesophageal reflux disease)    Hypertension    Neuropathy     Past Surgical History:  Procedure Laterality Date   BACK SURGERY     COLONOSCOPY     HAND SURGERY     72yo   HERNIA REPAIR        Inpatient Medications: Scheduled Meds:  atorvastatin  20 mg Oral Daily   Chlorhexidine Gluconate Cloth  6 each Topical Daily   pantoprazole  40 mg Oral Daily   Continuous Infusions:  diltiazem (CARDIZEM) infusion 5 mg/hr (02/26/23 0700)   heparin 1,200 Units/hr (02/26/23 0700)   magnesium sulfate bolus IVPB 2 g (02/26/23 0802)   PRN Meds: acetaminophen **OR** acetaminophen, ondansetron **OR** ondansetron (ZOFRAN) IV, polyethylene glycol  Allergies:   No Known Allergies  Social History:   Social History   Socioeconomic History   Marital status: Married    Spouse name: Not on file   Number of children: 2   Years of education: Not on file    Highest education level: Not on file  Occupational History   Occupation: Retired  Tobacco Use   Smoking status: Never   Smokeless tobacco: Not on file   Tobacco comments:    Smoked as a teenager  Building services engineer Use: Never used  Substance and Sexual Activity   Alcohol use: No   Drug use: No   Sexual activity: Not on file  Other Topics Concern   Not on file  Social History Narrative   Not on file   Social Determinants of Health   Financial Resource Strain: Not on file  Food Insecurity: No Food Insecurity (02/25/2023)   Hunger Vital Sign    Worried About Running Out of Food in the Last Year: Never true    Ran Out of Food in the Last Year: Never true  Transportation Needs: No Transportation Needs (02/25/2023)   PRAPARE - Administrator, Civil Service (Medical): No    Lack of Transportation (Non-Medical): No  Physical Activity: Not on file  Stress: Not on file  Social Connections: Not on file  Intimate Partner Violence: Not At Risk (02/25/2023)   Humiliation, Afraid, Rape, and Kick questionnaire    Fear of Current or Ex-Partner: No    Emotionally Abused: No    Physically Abused: No    Sexually Abused: No  Family History:    Family History  Problem Relation Age of Onset   Colon cancer Neg Hx    Stomach cancer Neg Hx    Esophageal cancer Neg Hx    Rectal cancer Neg Hx      ROS:  Please see the history of present illness.   All other ROS reviewed and negative.     Physical Exam/Data:   Vitals:   02/26/23 0510 02/26/23 0600 02/26/23 0712 02/26/23 0800  BP: 109/64 119/67  (!) 145/67  Pulse: (!) 137 (!) 58  80  Resp: 17 20  17   Temp:   97.6 F (36.4 C)   TempSrc:   Oral   SpO2: 95% 99%  99%  Weight:      Height:        Intake/Output Summary (Last 24 hours) at 02/26/2023 0834 Last data filed at 02/26/2023 0700 Gross per 24 hour  Intake 267.58 ml  Output 1450 ml  Net -1182.42 ml      02/26/2023    3:53 AM 02/25/2023    8:30 PM 02/25/2023     2:41 PM  Last 3 Weights  Weight (lbs) 183 lb 6.8 oz 183 lb 6.8 oz 186 lb 3.2 oz  Weight (kg) 83.2 kg 83.2 kg 84.46 kg     Body mass index is 27.89 kg/m.  General:  Well nourished, well developed, in no acute distress HEENT: normal Neck: no JVD Vascular: No carotid bruits; Distal pulses 2+ bilaterally Cardiac:  irregular, tachy Lungs:  clear to auscultation bilaterally, no wheezing, rhonchi or rales  Abd: soft, nontender, no hepatomegaly  Ext: no edema Musculoskeletal:  No deformities, BUE and BLE strength normal and equal Skin: warm and dry  Neuro:  CNs 2-12 intact, no focal abnormalities noted Psych:  Normal affect     Laboratory Data:  High Sensitivity Troponin:   Recent Labs  Lab 02/25/23 1626 02/25/23 1843  TROPONINIHS 7 7     Chemistry Recent Labs  Lab 02/25/23 1626 02/26/23 0208  NA 139 139  K 3.5 3.0*  CL 109 108  CO2 23 25  GLUCOSE 99 93  BUN 21 16  CREATININE 0.66 0.55*  CALCIUM 9.2 8.7*  MG 2.0 1.9  GFRNONAA >60 >60  ANIONGAP 7 6    Recent Labs  Lab 02/25/23 1626  PROT 6.5  ALBUMIN 4.0  AST 22  ALT 28  ALKPHOS 72  BILITOT 1.3*   Lipids No results for input(s): "CHOL", "TRIG", "HDL", "LABVLDL", "LDLCALC", "CHOLHDL" in the last 168 hours.  Hematology Recent Labs  Lab 02/25/23 1626 02/26/23 0208  WBC 6.9 5.7  RBC 4.61 4.58  HGB 13.9 13.9  HCT 41.8 41.2  MCV 90.7 90.0  MCH 30.2 30.3  MCHC 33.3 33.7  RDW 14.4 14.3  PLT 106* 102*   Thyroid  Recent Labs  Lab 02/25/23 1843  TSH 0.942    BNP Recent Labs  Lab 02/25/23 1626  BNP 144.0*    DDimer No results for input(s): "DDIMER" in the last 168 hours.   Radiology/Studies:  DG Chest Portable 1 View  Result Date: 02/25/2023 CLINICAL DATA:  Atrial fibrillation with rapid ventricular response EXAM: PORTABLE CHEST 1 VIEW COMPARISON:  01/15/2015 FINDINGS: 2 frontal views of the chest demonstrate an unremarkable cardiac silhouette. No acute airspace disease, effusion, or  pneumothorax. There are no acute displaced fractures. IMPRESSION: 1. No acute intrathoracic process. Electronically Signed   By: Sharlet Salina M.D.   On: 02/25/2023 16:51  Assessment and Plan:   1.Aflutter - notes mention prior history of afib in 2016, was not on anticoag prior to admission - admitted from clinic with aflutter with RVR - started on diltiazem drip, hep gtt - CHADS2Vasc score is at least 2  - rates well controlled this AM on dilt gtt at 5.  - start lopressor 50mg  bid, d/c drip.  - transition heparin drip to eliquis 5mg  bid - can rate control for time being, at 3 weeks of anticoagulation could consider trial of DCCV in setting of newly diagnosed aflutter.      For questions or updates, please contact [facility] Please consult [email or web address] for contact info under    Signed, Dina Rich, MD  02/26/2023 8:34 AM

## 2023-02-26 NOTE — Care Management Obs Status (Signed)
MEDICARE OBSERVATION STATUS NOTIFICATION   Patient Details  Name: Ronald Rivas MRN: [id number] Date of Birth: 06/13/1951   Medicare Observation Status Notification Given:  Yes    Elliot Gault, LCSW 02/26/2023, 12:37 PM

## 2023-02-26 NOTE — Care Management CC44 (Signed)
Condition Code 44 Documentation Completed  Patient Details  Name: Ronald Rivas MRN: [id number] Date of Birth: 1950-11-05   Condition Code 44 given:  Yes Patient signature on Condition Code 44 notice:  Yes Documentation of 2 MD's agreement:  Yes Code 44 added to claim:  Yes    Elliot Gault, LCSW 02/26/2023, 12:37 PM

## 2023-02-26 NOTE — Progress Notes (Signed)
*  PRELIMINARY RESULTS* Echocardiogram 2D Echocardiogram has been performed.  Stacey Drain 02/26/2023, 2:01 PM

## 2023-02-27 ENCOUNTER — Ambulatory Visit: Payer: Medicare Other | Admitting: Internal Medicine

## 2023-03-27 NOTE — Progress Notes (Signed)
Cardiology Office Note:    Date:  04/08/2023   ID:  Ronald Rivas, DOB April 15, 1951, MRN [id number]  PCP:  Patient, No Pcp Per  [facility] Providers Cardiologist:  Marjo Bicker, MD     Referring MD: No ref. provider found   Chief Complaint:  Follow-up     History of Present Illness:   Ronald Rivas is a 72 y.o. male with  of HTN, GERD, aflutter diagnosed 12/2022 during colonscopy. Notes indicate prior history of afib in 2016, however appears was not on anticoagulation since that time.   Seen in cardiology clinic 02/25/23 as new patient, found to be in aflutter with RVR, sent to ER for evaluaton and admission. Rate controlled with metoprolol with plans of DCCV after 3 weeks of anticoagulation.Echo 02/26/23 normal LVEF severe LA enlargement.  Patient comes in with his wife. Feels good. No chest pain, dyspnea, palpitations. Has neuropathy in his feet. He's more fatigued. He's changed his diet and trying to be healthy. He stays busy with 50 bee hives.  He's lost 7 lbs.      Past Medical History:  Diagnosis Date   atrial fibrillation with rvr 2016   GERD (gastroesophageal reflux disease)    Hypertension    Neuropathy    Current Medications: Current Meds  Medication Sig   apixaban (ELIQUIS) 5 MG TABS tablet Take 1 tablet (5 mg total) by mouth 2 (two) times daily.   atorvastatin (LIPITOR) 20 MG tablet Take 20 mg by mouth daily.   furosemide (LASIX) 20 MG tablet Take 20 mg by mouth as needed for fluid.   lisinopril (ZESTRIL) 20 MG tablet Take 20 mg by mouth daily.   metoprolol tartrate (LOPRESSOR) 50 MG tablet Take 1 tablet (50 mg total) by mouth 2 (two) times daily.   Multiple Vitamin (MULTIVITAMIN WITH MINERALS) TABS tablet Take 1 tablet by mouth daily.   omeprazole (PRILOSEC) 20 MG capsule Take 20 mg by mouth daily.    Allergies:   Patient has no known allergies.   Social History   Tobacco Use   Smoking status: Never   Tobacco comments:    Smoked as a teenager   Vaping Use   Vaping Use: Never used  Substance Use Topics   Alcohol use: No   Drug use: No    Family Hx: The patient's family history is negative for Colon cancer, Stomach cancer, Esophageal cancer, and Rectal cancer.  ROS     Physical Exam:    VS:  BP 118/74   Pulse 68   Ht 5' 7.5" (1.715 m)   Wt 182 lb (82.6 kg)   SpO2 93%   BMI 28.08 kg/m     Wt Readings from Last 3 Encounters:  04/08/23 182 lb (82.6 kg)  02/26/23 183 lb 6.8 oz (83.2 kg)  02/25/23 186 lb 3.2 oz (84.5 kg)    Physical Exam  GEN: Well nourished, well developed, in no acute distress  Neck: no JVD, carotid bruits, or masses Cardiac:RRR; no murmurs, rubs, or gallops  Respiratory:  clear to auscultation bilaterally, normal work of breathing GI: soft, nontender, nondistended, + BS Ext: without cyanosis, clubbing, or edema, Good distal pulses bilaterally Neuro:  Alert and Oriented x 3,  Psych: euthymic mood, full affect        EKGs/Labs/Other Test Reviewed:    EKG:  EKG is  ordered today.  The ekg ordered today demonstrates NSR with first degree AV block otherwise normal  Recent Labs: 02/25/2023: ALT 28; B Natriuretic  Peptide 144.0; TSH 0.942 02/26/2023: BUN 16; Creatinine, Ser 0.55; Hemoglobin 13.9; Magnesium 1.9; Platelets 102; Potassium 3.0; Sodium 139   Recent Lipid Panel No results for input(s): "CHOL", "TRIG", "HDL", "VLDL", "LDLCALC", "LDLDIRECT" in the last 8760 hours.   Prior CV Studies:   Echo 02/26/23 IMPRESSIONS     1. Left ventricular ejection fraction, by estimation, is 55 to 60%. The  left ventricle has normal function. The left ventricle has no regional  wall motion abnormalities. Left ventricular diastolic parameters are  indeterminate.   2. Right ventricular systolic function is normal. The right ventricular  size is normal. Tricuspid regurgitation signal is inadequate for assessing  PA pressure.   3. Left atrial size was severely dilated.   4. Right atrial size was  moderately dilated.   5. The mitral valve is normal in structure. Mild mitral valve  regurgitation. No evidence of mitral stenosis.   6. The tricuspid valve is abnormal.   7. The aortic valve is tricuspid. There is mild calcification of the  aortic valve. There is mild thickening of the aortic valve. Aortic valve  regurgitation is mild. No aortic stenosis is present.   8. The inferior vena cava is dilated in size with >50% respiratory  variability, suggesting right atrial pressure of 8 mmHg.     Risk Assessment/Calculations/Metrics:    CHA2DS2-VASc Score = 2   This indicates a 2.2% annual risk of stroke. The patient's score is based upon: CHF History: 0 HTN History: 1 Diabetes History: 0 Stroke History: 0 Vascular Disease History: 0 Age Score: 1 Gender Score: 0             ASSESSMENT & PLAN:   No problem-specific Assessment & Plan notes found for this encounter.   Aflutter now on eliquis and metoprolol. He's in NSR today. Some fatigue on metoprolol but willing to continue taking for now. He has f/u in Aug. If still having problems consider decreasing dose or switching to diltiazem. Eliquis costing him $255/month. Will give him paperwork to apply for patient assistance.  HTN BP well controlled  HLD-on lipitor managed by PCP-can't view labs  Hypokalemia-last K 3.0. lasix on his list prn but he's not taking. Repeat bmet today              Dispo:  No follow-ups on file.   Medication Adjustments/Labs and Tests Ordered: Current medicines are reviewed at length with the patient today.  Concerns regarding medicines are outlined above.  Tests Ordered: Orders Placed This Encounter  Procedures   Basic Metabolic Panel (BMET)   EKG 12-Lead   Medication Changes: No orders of the defined types were placed in this encounter.  Signed, Jacolyn Reedy, PA-C  04/08/2023 1:32 PM    [facility] [address] [place], [place], [place]  [address] Phone: [phone]; Fax: [phone]

## 2023-04-08 ENCOUNTER — Ambulatory Visit: Payer: Medicare Other | Attending: Physician Assistant | Admitting: Physician Assistant

## 2023-04-08 ENCOUNTER — Telehealth: Payer: Self-pay | Admitting: *Deleted

## 2023-04-08 ENCOUNTER — Encounter: Payer: Self-pay | Admitting: Physician Assistant

## 2023-04-08 VITALS — BP 118/74 | HR 68 | Ht 67.5 in | Wt 182.0 lb

## 2023-04-08 DIAGNOSIS — E876 Hypokalemia: Secondary | ICD-10-CM | POA: Insufficient documentation

## 2023-04-08 DIAGNOSIS — I10 Essential (primary) hypertension: Secondary | ICD-10-CM | POA: Insufficient documentation

## 2023-04-08 DIAGNOSIS — I484 Atypical atrial flutter: Secondary | ICD-10-CM | POA: Insufficient documentation

## 2023-04-08 DIAGNOSIS — E7849 Other hyperlipidemia: Secondary | ICD-10-CM | POA: Insufficient documentation

## 2023-04-08 MED ORDER — APIXABAN 5 MG PO TABS: 5.0000 mg | ORAL_TABLET | Freq: Two times a day (BID) | ORAL | 0 refills | Status: DC

## 2023-04-08 NOTE — Telephone Encounter (Signed)
Pt provided with samples of Eliquis

## 2023-04-08 NOTE — Patient Instructions (Signed)
Medication Instructions:  Your physician recommends that you continue on your current medications as directed. Please refer to the Current Medication list given to you today.  *If you need a refill on your cardiac medications before your next appointment, please call your pharmacy*   Lab Work: Your physician recommends that you return for lab work today.   If you have labs (blood work) drawn today and your tests are completely normal, you will receive your results only by: MyChart Message (if you have MyChart) OR A paper copy in the mail If you have any lab test that is abnormal or we need to change your treatment, we will call you to review the results.   Testing/Procedures: NONE    Follow-Up: At [facility], you and your health needs are our priority.  As part of our continuing mission to provide you with exceptional heart care, we have created designated Provider Care Teams.  These Care Teams include your primary Cardiologist (physician) and Advanced Practice Providers (APPs -  Physician Assistants and Nurse Practitioners) who all work together to provide you with the care you need, when you need it.  We recommend signing up for the patient portal called "MyChart".  Sign up information is provided on this After Visit Summary.  MyChart is used to connect with patients for Virtual Visits (Telemedicine).  Patients are able to view lab/test results, encounter notes, upcoming appointments, etc.  Non-urgent messages can be sent to your provider as well.   To learn more about what you can do with MyChart, go to [email or web address].    Your next appointment:    As Scheduled   Provider:   Luane School, MD    Other Instructions Thank you for choosing [facility]!

## 2023-04-08 NOTE — Telephone Encounter (Signed)
Refill request for Eliquis samples received. Indication: A Flutter Last office visit: 04/07/22  Leda Gauze PA-C Scr: 0.55 on 02/26/23  Epic Age: 72 Weight: 82.6kg  Based on above findings Eliquis 5mg  twice daily is the appropriate dose.  OK to give samples if available.

## 2023-04-29 ENCOUNTER — Other Ambulatory Visit: Payer: Self-pay

## 2023-04-29 ENCOUNTER — Telehealth: Payer: Self-pay | Admitting: Internal Medicine

## 2023-04-29 MED ORDER — METOPROLOL TARTRATE 50 MG PO TABS: 50.0000 mg | ORAL_TABLET | Freq: Two times a day (BID) | ORAL | 1 refills | Status: DC

## 2023-04-29 MED ORDER — APIXABAN 5 MG PO TABS: 5.0000 mg | ORAL_TABLET | Freq: Two times a day (BID) | ORAL | 5 refills | Status: DC

## 2023-04-29 NOTE — Telephone Encounter (Signed)
*  STAT* If patient is at the pharmacy, call can be transferred to refill team.   1. Which medications need to be refilled? (please list name of each medication and dose if known) apixaban (ELIQUIS) 5 MG TABS tablet  metoprolol tartrate (LOPRESSOR) 50 MG tablet (Expired)   2. Which pharmacy/location (including street and city if local pharmacy) is medication to be sent to? [id number] - [place], [place] - [address]   3. Do they need a 30 day or 90 day supply? 30  Patient is at the pharmacy now

## 2023-04-29 NOTE — Telephone Encounter (Signed)
Prescription refill request for Eliquis received. Indication: AF Last office visit:  04/08/23  Leda Gauze PA-C Scr: 0.55 on 02/26/23  Epic Age: 72 Weight: 82.6kg  Based on above findings Eliquis 5mg  twice daily is the appropriate dose.  Refill approved.

## 2023-04-29 NOTE — Telephone Encounter (Signed)
I refilled lopressor, I will forward eliquis to L.reid,RN

## 2023-05-21 ENCOUNTER — Other Ambulatory Visit: Payer: Self-pay | Admitting: Internal Medicine

## 2023-06-03 ENCOUNTER — Other Ambulatory Visit: Payer: Self-pay | Admitting: *Deleted

## 2023-06-03 ENCOUNTER — Ambulatory Visit: Payer: Medicare Other | Attending: Internal Medicine | Admitting: Internal Medicine

## 2023-06-03 ENCOUNTER — Encounter: Payer: Self-pay | Admitting: Internal Medicine

## 2023-06-03 VITALS — BP 112/70 | HR 50 | Ht 67.0 in | Wt 174.8 lb

## 2023-06-03 DIAGNOSIS — I484 Atypical atrial flutter: Secondary | ICD-10-CM | POA: Diagnosis present

## 2023-06-03 DIAGNOSIS — I351 Nonrheumatic aortic (valve) insufficiency: Secondary | ICD-10-CM | POA: Diagnosis present

## 2023-06-03 DIAGNOSIS — I4892 Unspecified atrial flutter: Secondary | ICD-10-CM | POA: Diagnosis present

## 2023-06-03 DIAGNOSIS — I480 Paroxysmal atrial fibrillation: Secondary | ICD-10-CM | POA: Insufficient documentation

## 2023-06-03 MED ORDER — BLOOD PRESSURE KIT: 1.0000 | PACK | 0 refills | Status: AC

## 2023-06-03 MED ORDER — APIXABAN 5 MG PO TABS: 5.0000 mg | ORAL_TABLET | Freq: Two times a day (BID) | ORAL | 0 refills | Status: DC

## 2023-06-03 MED ORDER — APIXABAN 5 MG PO TABS: 5.0000 mg | ORAL_TABLET | Freq: Two times a day (BID) | ORAL | 5 refills | Status: DC

## 2023-06-03 MED ORDER — METOPROLOL TARTRATE 50 MG PO TABS: 50.0000 mg | ORAL_TABLET | Freq: Two times a day (BID) | ORAL | 1 refills | Status: DC

## 2023-06-03 NOTE — Addendum Note (Signed)
Addended by: Kerney Elbe on: 06/03/2023 12:41 PM   Modules accepted: Orders

## 2023-06-03 NOTE — Telephone Encounter (Signed)
Prescription refill request for Eliquis received. Indication: A Flutter Last office visit: 06/03/23  Ronald Carbo MD Scr: 0.55 on 02/26/23 Age: 72 Weight: 79.3kg  Based on above findings Eliquis 5mg  twice daily is the appropriate dose.  Refill approved.

## 2023-06-03 NOTE — Progress Notes (Signed)
Cardiology Office Note  Date: 06/03/2023   ID: Detroy Fullen, DOB 05/08/1951, MRN [id number]  PCP:  Patient, No Pcp Per  Cardiologist:  Marjo Bicker, MD Electrophysiologist:  None    History of Present Illness: Ronald Rivas is a 72 y.o. male known to have paroxysmal atrial flutter in 2024, paroxysmal A-fib in 2016, HTN, GERD is here for follow-up visit.  Patient underwent colonoscopy in [facility] in 3/24 when he was noted to have new onset atrial fibrillation with RVR.  He underwent colonoscopy after his rate was controlled with IV medications.  He was sent to the ER from cardiology clinic in 4/24 due to persistent atrial flutter with RVR, asymptomatic.  Rates were controlled with diltiazem drip and switched to metoprolol tartrate 50 mg a day. He had a prior history of A-fib in 2016.  He is here for follow-up visit, comes in with his wife.  No symptoms, no SOB, fatigue, palpitations.  No dizziness, lightheadedness, syncope.  Has some minimal leg swelling.  Has some rash on his foot which is chronic and reported it could be from the chemical exposure at work when he was younger.  He has some numbness in his legs, chronic and wants to get physical therapy to get more strength.  Past Medical History:  Diagnosis Date   atrial fibrillation with rvr 2016   GERD (gastroesophageal reflux disease)    Hypertension    Neuropathy     Past Surgical History:  Procedure Laterality Date   BACK SURGERY     COLONOSCOPY     HAND SURGERY     72yo   HERNIA REPAIR      Current Outpatient Medications  Medication Sig Dispense Refill   apixaban (ELIQUIS) 5 MG TABS tablet Take 1 tablet (5 mg total) by mouth 2 (two) times daily. 60 tablet 5   atorvastatin (LIPITOR) 20 MG tablet Take 20 mg by mouth daily.     furosemide (LASIX) 20 MG tablet Take 20 mg by mouth as needed for fluid.     lisinopril (ZESTRIL) 20 MG tablet Take 20 mg by mouth daily.     metoprolol tartrate (LOPRESSOR) 50 MG tablet  TAKE 1 TABLET BY MOUTH TWICE A DAY 180 tablet 1   Multiple Vitamin (MULTIVITAMIN WITH MINERALS) TABS tablet Take 1 tablet by mouth daily.     omeprazole (PRILOSEC) 20 MG capsule Take 20 mg by mouth daily.     No current facility-administered medications for this visit.   Allergies:  Patient has no known allergies.   Social History: The patient  reports that he has never smoked. He does not have any smokeless tobacco history on file. He reports that he does not drink alcohol and does not use drugs.   Family History: The patient's family history is not on file.   ROS:  Please see the history of present illness. Otherwise, complete review of systems is positive for none.  All other systems are reviewed and negative.   Physical Exam: VS:  There were no vitals taken for this visit., BMI There is no height or weight on file to calculate BMI.  Wt Readings from Last 3 Encounters:  04/08/23 182 lb (82.6 kg)  02/26/23 183 lb 6.8 oz (83.2 kg)  02/25/23 186 lb 3.2 oz (84.5 kg)    General: Patient appears comfortable at rest. HEENT: Conjunctiva and lids normal, oropharynx clear with moist mucosa. Neck: Supple, no elevated JVP or carotid bruits, no thyromegaly. Lungs: Clear to auscultation, nonlabored breathing  at rest. Cardiac: Regular rate and rhythm, no S3 or significant systolic murmur, no pericardial rub. Abdomen: Soft, nontender, no hepatomegaly, bowel sounds present, no guarding or rebound. Extremities: No pitting edema, distal pulses 2+. Skin: Warm and dry. Musculoskeletal: No kyphosis. Neuropsychiatric: Alert and oriented x3, affect grossly appropriate.  Recent Labwork: 02/25/2023: ALT 28; AST 22; B Natriuretic Peptide 144.0; TSH 0.942 02/26/2023: BUN 16; Creatinine, Ser 0.55; Hemoglobin 13.9; Magnesium 1.9; Platelets 102; Potassium 3.0; Sodium 139  No results found for: "CHOL", "TRIG", "HDL", "CHOLHDL", "VLDL", "LDLCALC", "LDLDIRECT"  Other Studies Reviewed Today: I personally  reviewed the EKG strips from 01/22/2023 which the patient brought with him.  Assessment and Plan: Patient is a 72 year old M known to have HTN, GERD self referred to cardiology clinic for evaluation of elevated heart rates.  # Paroxysmal atrial flutter in 01/2023 # Paroxysmal atrial fibrillation in 2016 -EKG today showed NSR, sinus bradycardia with HR 47 bpm.  No symptoms of dizziness, lightheadedness, fatigue or syncope.  Okay to continue metoprolol tartrate 50 mg twice daily.  If he develops any signs of bradycardia in the future, he is instructed to cut back on the dose of metoprolol tartrate.  Continue Eliquis 5 mg twice daily.  TSH within normal limits and does not have OSA symptoms.  # Mild AI in 04/24 Echo -Echo from 4/24 showed mild aortic valve regurgitation.  Normal aorta.  Will repeat echocardiogram in 3 to 5 years.  # HTN, controlled -Continue lisinopril 20 mg once daily, follows with PCP in [place].  Will prescribe blood pressure kit.  # HLD -Continue atorvastatin 20 mg at bedtime, goal LDL less than 100.   I have spent a total of 30 minutes with patient reviewing chart, EKGs, labs and examining patient as well as establishing an assessment and plan that was discussed with the patient.  > 50% of time was spent in direct patient care.    Medication Adjustments/Labs and Tests Ordered: Current medicines are reviewed at length with the patient today.  Concerns regarding medicines are outlined above.   Tests Ordered: Orders Placed This Encounter  Procedures   EKG 12-Lead     Medication Changes: Meds ordered this encounter  Medications   Blood Pressure KIT    Sig: 1 Device by Does not apply route as directed.    Dispense:  1 kit    Refill:  0   apixaban (ELIQUIS) 5 MG TABS tablet    Sig: Take 1 tablet (5 mg total) by mouth 2 (two) times daily.    Dispense:  28 tablet    Refill:  0    Order Specific Question:   Lot Number?    Answer:   [id number]    Order Specific  Question:   Expiration Date?    Answer:   07/28/2024      Disposition:  Follow up 6 months Signed, Kanisha Duba Verne Spurr, MD, 06/03/2023 11:12 AM    [facility] [address], [place], [place] [address]

## 2023-06-03 NOTE — Patient Instructions (Signed)
Medication Instructions:  Your physician recommends that you continue on your current medications as directed. Please refer to the Current Medication list given to you today.  *If you need a refill on your cardiac medications before your next appointment, please call your pharmacy*   Lab Work: NONE   If you have labs (blood work) drawn today and your tests are completely normal, you will receive your results only by: MyChart Message (if you have MyChart) OR A paper copy in the mail If you have any lab test that is abnormal or we need to change your treatment, we will call you to review the results.   Testing/Procedures: NONE    Follow-Up: At [facility], you and your health needs are our priority.  As part of our continuing mission to provide you with exceptional heart care, we have created designated Provider Care Teams.  These Care Teams include your primary Cardiologist (physician) and Advanced Practice Providers (APPs -  Physician Assistants and Nurse Practitioners) who all work together to provide you with the care you need, when you need it.  We recommend signing up for the patient portal called "MyChart".  Sign up information is provided on this After Visit Summary.  MyChart is used to connect with patients for Virtual Visits (Telemedicine).  Patients are able to view lab/test results, encounter notes, upcoming appointments, etc.  Non-urgent messages can be sent to your provider as well.   To learn more about what you can do with MyChart, go to [email or web address].    Your next appointment:   6 month(s)  Provider:   You may see Vishnu P Mallipeddi, MD or one of the following Advanced Practice Providers on your designated Care Team:   Randall An, PA-C  Jacolyn Reedy, PA-C     Other Instructions Thank you for choosing [facility]!

## 2023-12-10 ENCOUNTER — Ambulatory Visit: Payer: 59 | Admitting: Internal Medicine

## 2024-01-09 ENCOUNTER — Encounter: Payer: Self-pay | Admitting: Internal Medicine

## 2024-01-09 ENCOUNTER — Ambulatory Visit: Payer: 59 | Attending: Internal Medicine | Admitting: Internal Medicine

## 2024-01-09 VITALS — BP 136/80 | HR 50 | Ht 68.0 in | Wt 188.2 lb

## 2024-01-09 DIAGNOSIS — I4892 Unspecified atrial flutter: Secondary | ICD-10-CM | POA: Insufficient documentation

## 2024-01-09 MED ORDER — METOPROLOL TARTRATE 25 MG PO TABS: 37.5000 mg | ORAL_TABLET | Freq: Two times a day (BID) | ORAL | 3 refills | Status: DC

## 2024-01-09 NOTE — Progress Notes (Signed)
 Cardiology Office Note  Date: 01/09/2024   ID: [name], DOB November 09, 1950, MRN [id number]  PCP:  Patient, No Pcp Per  Cardiologist:  Marjo Bicker, MD Electrophysiologist:  None    History of Present Illness: Ronald Rivas is a 73 y.o. male known to have paroxysmal atrial flutter in 2024, paroxysmal A-fib in 2016, HTN, GERD is here for follow-up visit.  Patient underwent colonoscopy in [place] in 3/24 when he was noted to have new onset atrial fibrillation with RVR.  He underwent colonoscopy after his rate was controlled with IV medications.  He was sent to the ER from cardiology clinic in 4/24 due to persistent atrial flutter with RVR, asymptomatic.  Rates were controlled with diltiazem drip and switched to metoprolol tartrate 50 mg a day. He had a prior history of A-fib in 2016.  He is here for follow-up visit, comes in with his wife.    No interval palpitations, severe fatigue or DOE.  No angina, dizziness, presyncope, syncope or lightheadedness.  No leg swelling.  Overall doing great.  He is physically active.  He has numbness in bilateral lower extremities which is chronic.  Past Medical History:  Diagnosis Date   atrial fibrillation with rvr 2016   GERD (gastroesophageal reflux disease)    Hypertension    Neuropathy     Past Surgical History:  Procedure Laterality Date   BACK SURGERY     COLONOSCOPY     HAND SURGERY     73yo   HERNIA REPAIR      Current Outpatient Medications  Medication Sig Dispense Refill   apixaban (ELIQUIS) 5 MG TABS tablet Take 1 tablet (5 mg total) by mouth 2 (two) times daily. 60 tablet 5   atorvastatin (LIPITOR) 20 MG tablet Take 20 mg by mouth daily.     Blood Pressure KIT 1 Device by Does not apply route as directed. 1 kit 0   lisinopril (ZESTRIL) 20 MG tablet Take 20 mg by mouth daily.     metoprolol tartrate (LOPRESSOR) 50 MG tablet Take 1 tablet (50 mg total) by mouth 2 (two) times daily. 180 tablet 1   Multiple Vitamin  (MULTIVITAMIN WITH MINERALS) TABS tablet Take 1 tablet by mouth daily.     omeprazole (PRILOSEC) 20 MG capsule Take 20 mg by mouth daily.     No current facility-administered medications for this visit.   Allergies:  Patient has no known allergies.   Social History: The patient  reports that he has never smoked. He does not have any smokeless tobacco history on file. He reports that he does not drink alcohol and does not use drugs.   Family History: The patient's family history is not on file.   ROS:  Please see the history of present illness. Otherwise, complete review of systems is positive for none.  All other systems are reviewed and negative.   Physical Exam: VS:  Ht 5\' 8"  (1.727 m)   Wt 188 lb 3.2 oz (85.4 kg)   BMI 28.62 kg/m , BMI Body mass index is 28.62 kg/m.  Wt Readings from Last 3 Encounters:  01/09/24 188 lb 3.2 oz (85.4 kg)  06/03/23 174 lb 12.8 oz (79.3 kg)  04/08/23 182 lb (82.6 kg)    General: Patient appears comfortable at rest. HEENT: Conjunctiva and lids normal, oropharynx clear with moist mucosa. Neck: Supple, no elevated JVP or carotid bruits, no thyromegaly. Lungs: Clear to auscultation, nonlabored breathing at rest. Cardiac: Regular rate and rhythm, no S3 or  significant systolic murmur, no pericardial rub. Abdomen: Soft, nontender, no hepatomegaly, bowel sounds present, no guarding or rebound. Extremities: No pitting edema, distal pulses 2+. Skin: Warm and dry. Musculoskeletal: No kyphosis. Neuropsychiatric: Alert and oriented x3, affect grossly appropriate.  Recent Labwork: 02/25/2023: ALT 28; AST 22; B Natriuretic Peptide 144.0; TSH 0.942 02/26/2023: BUN 16; Creatinine, Ser 0.55; Hemoglobin 13.9; Magnesium 1.9; Platelets 102; Potassium 3.0; Sodium 139  No results found for: "CHOL", "TRIG", "HDL", "CHOLHDL", "VLDL", "LDLCALC", "LDLDIRECT"  Other Studies Reviewed Today: I personally reviewed the EKG strips from 01/22/2023 which the patient brought with  him.  Assessment and Plan:  # Paroxysmal atrial flutter in 01/2023 # Paroxysmal atrial fibrillation in 2016 -No interval palpitations, severe fatigue or DOE.  EKG today showed sinus bradycardia with HR 54 bpm and PACs.  Asymptomatic.  Will cut back on the metoprolol to tartrate dose from 50 mg to 37.5 mg twice daily.  Continue Eliquis 5 mg twice daily.  TSH normal.  Does not have OSA symptoms.  # Mild AI in 04/24 Echo -Asymptomatic, will repeat echocardiogram in 3 to 5 years.  # HTN, controlled -Continue current antihypertensives, lisinopril 20 mg once daily.  Continue metoprolol for above-stated indication.  Follows with PCP in [place].  # HLD -Continue atorvastatin 20 mg at bedtime.  Goal LDL less than 100.  Follows with PCP.   Medication Adjustments/Labs and Tests Ordered: Current medicines are reviewed at length with the patient today.  Concerns regarding medicines are outlined above.   Tests Ordered: Orders Placed This Encounter  Procedures   EKG 12-Lead     Medication Changes: No orders of the defined types were placed in this encounter.     Disposition:  Follow up 1 year Signed, Lindsee Labarre Verne Spurr, MD, 01/09/2024 9:39 AM    [facility] at [facility] [address], [place], [place] [address]

## 2024-01-09 NOTE — Patient Instructions (Signed)
 Medication Instructions:  Your physician has recommended you make the following change in your medication:   -Decrease Metoprolol to 37.5 mg ( 1.5 tablets) twice daily   *If you need a refill on your cardiac medications before your next appointment, please call your pharmacy*   Lab Work: None If you have labs (blood work) drawn today and your tests are completely normal, you will receive your results only by: MyChart Message (if you have MyChart) OR A paper copy in the mail If you have any lab test that is abnormal or we need to change your treatment, we will call you to review the results.   Testing/Procedures: None   Follow-Up: At [facility], you and your health needs are our priority.  As part of our continuing mission to provide you with exceptional heart care, we have created designated Provider Care Teams.  These Care Teams include your primary Cardiologist (physician) and Advanced Practice Providers (APPs -  Physician Assistants and Nurse Practitioners) who all work together to provide you with the care you need, when you need it.  We recommend signing up for the patient portal called "MyChart".  Sign up information is provided on this After Visit Summary.  MyChart is used to connect with patients for Virtual Visits (Telemedicine).  Patients are able to view lab/test results, encounter notes, upcoming appointments, etc.  Non-urgent messages can be sent to your provider as well.   To learn more about what you can do with MyChart, go to [email or web address].    Your next appointment:   1 year(s)  Provider:   You may see Vishnu P Mallipeddi, MD or one of the following Advanced Practice Providers on your designated Care Team:   [place], PA-C  Jacolyn Reedy, [place]     Other Instructions

## 2024-04-11 ENCOUNTER — Other Ambulatory Visit: Payer: Self-pay | Admitting: Internal Medicine

## 2024-04-13 NOTE — Telephone Encounter (Signed)
 Prescription refill request for Eliquis  received. Indication:afib Last office visit:3/25 Scr:0.68 Age: 73 Weight:85.4  kg  Prescription refilled

## 2024-07-24 ENCOUNTER — Encounter: Payer: Self-pay | Admitting: Internal Medicine

## 2024-09-08 ENCOUNTER — Other Ambulatory Visit: Payer: Self-pay | Admitting: Internal Medicine

## 2024-10-06 ENCOUNTER — Other Ambulatory Visit: Payer: Self-pay | Admitting: Internal Medicine

## 2024-10-06 DIAGNOSIS — I480 Paroxysmal atrial fibrillation: Secondary | ICD-10-CM

## 2024-10-06 NOTE — Telephone Encounter (Signed)
 Prescription refill request for Eliquis  received. Indication:afib Last office visit:3/25 Scr: needs labs Age:73 Weight:85.4  kg  Prescription refilled

## 2024-11-10 ENCOUNTER — Telehealth: Payer: Self-pay | Admitting: Internal Medicine

## 2024-11-10 NOTE — Telephone Encounter (Signed)
 Pts wife came into the office and scheduled the pts 1 year f/u w/Dr. Mallipeddi. The pts wife also stated that their insurance has changed their RX part. They are now getting [facility]. The pts wife stated the pt will need a refill on metoprolol  tartrate before his 1 year visit is scheduled. [phone] is the best number to reach the pts wife. Please call wife to confirm information went though.

## 2024-11-12 MED ORDER — METOPROLOL TARTRATE 25 MG PO TABS: 37.5000 mg | ORAL_TABLET | Freq: Two times a day (BID) | ORAL | 0 refills | Status: AC

## 2024-11-12 NOTE — Telephone Encounter (Signed)
 Refill of Metoprolol  has been sent to [facility].  Retrned call to pt to make him aware.  Left a message.

## 2025-02-10 ENCOUNTER — Ambulatory Visit: Admitting: Internal Medicine
